# Patient Record
Sex: Male | Born: 1985 | Race: White | Hispanic: No | Marital: Single | Smoking: Current every day smoker
Health system: Southern US, Community
[De-identification: ages and names within clinical notes are randomized; demographics above are authoritative.]

## PROBLEM LIST (undated history)

## (undated) DIAGNOSIS — R569 Unspecified convulsions: Secondary | ICD-10-CM

## (undated) DIAGNOSIS — R011 Cardiac murmur, unspecified: Secondary | ICD-10-CM

## (undated) HISTORY — PX: HAND SURGERY: SHX662

## (undated) HISTORY — PX: ABSCESS DRAINAGE: SHX1119

---
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
## 2014-11-17 ENCOUNTER — Encounter (HOSPITAL_COMMUNITY): Payer: Self-pay | Admitting: *Deleted

## 2014-11-17 ENCOUNTER — Emergency Department (HOSPITAL_COMMUNITY)
Admission: EM | Admit: 2014-11-17 | Discharge: 2014-11-17 | Disposition: A | Discharge status: Home / Self Care | Payer: Self-pay | Attending: Emergency Medicine | Admitting: Emergency Medicine

## 2014-11-17 DIAGNOSIS — Z720 Tobacco use: Secondary | ICD-10-CM | POA: Insufficient documentation

## 2014-11-17 DIAGNOSIS — Z880 Allergy status to penicillin: Secondary | ICD-10-CM | POA: Insufficient documentation

## 2014-11-17 DIAGNOSIS — K029 Dental caries, unspecified: Secondary | ICD-10-CM | POA: Insufficient documentation

## 2014-11-17 DIAGNOSIS — K0889 Other specified disorders of teeth and supporting structures: Secondary | ICD-10-CM

## 2014-11-17 DIAGNOSIS — K047 Periapical abscess without sinus: Secondary | ICD-10-CM | POA: Insufficient documentation

## 2014-11-17 MED ORDER — HYDROCODONE-ACETAMINOPHEN 5-325 MG PO TABS
1.0000 | ORAL_TABLET | Freq: Once | ORAL | Status: AC
  Administered 2014-11-17: 1 via ORAL
  Filled 2014-11-17: qty 1

## 2014-11-17 MED ORDER — HYDROCODONE-ACETAMINOPHEN 5-325 MG PO TABS: 1.0000 | ORAL_TABLET | ORAL | Status: DC | PRN

## 2014-11-17 MED ORDER — CLINDAMYCIN HCL 150 MG PO CAPS
300.0000 mg | ORAL_CAPSULE | Freq: Once | ORAL | Status: AC
  Administered 2014-11-17: 300 mg via ORAL
  Filled 2014-11-17: qty 2

## 2014-11-17 MED ORDER — NAPROXEN 500 MG PO TABS: 500.0000 mg | ORAL_TABLET | Freq: Two times a day (BID) | ORAL | Status: DC

## 2014-11-17 MED ORDER — CLINDAMYCIN HCL 150 MG PO CAPS: 300.0000 mg | ORAL_CAPSULE | Freq: Three times a day (TID) | ORAL | Status: DC

## 2014-11-17 NOTE — ED Provider Notes (Signed)
CSN: [id number]     Arrival date & time 11/17/14  1606 History   First MD Initiated Contact with Patient 11/17/14 1639     Chief Complaint  Patient presents with  . Dental Pain     (Consider location/radiation/quality/duration/timing/severity/associated sxs/prior Treatment) Patient is a 29 y.o. male presenting with tooth pain. The history is provided by the patient.  Dental Pain Severity:  Moderate Onset quality:  Gradual Duration:  3 days Timing:  Constant Progression:  Worsening Chronicity:  New Context: abscess   Relieved by:  Nothing Worsened by:  Cold food/drink and pressure Ineffective treatments:  Topical anesthetic gel Associated symptoms: facial pain and facial swelling   Risk factors: smoking    Trevor Pacheco is a 29 y.o. male who presents to the ED with dental pain that started 3 days ago and facial swelling that started last night.    History reviewed. No pertinent past medical history. Past Surgical History  Procedure Laterality Date  . Hand surgery     No family history on file. Social History  Substance Use Topics  . Smoking status: Current Every Day Smoker -- 0.50 packs/day    Types: Cigarettes  . Smokeless tobacco: None  . Alcohol Use: No    Review of Systems  HENT: Positive for facial swelling.       Allergies  Penicillins  Home Medications   Prior to Admission medications   Medication Sig Start Date End Date Taking? Authorizing Provider  clindamycin (CLEOCIN) 150 MG capsule Take 2 capsules (300 mg total) by mouth 3 (three) times daily. 11/17/14   Analiyah Lechuga Orlene OchM Ahmod Gillespie, NP  HYDROcodone-acetaminophen (NORCO/VICODIN) 5-325 MG tablet Take 1 tablet by mouth every 4 (four) hours as needed. 11/17/14   Jayda White Orlene OchM Cadynce Garrette, NP  naproxen (NAPROSYN) 500 MG tablet Take 1 tablet (500 mg total) by mouth 2 (two) times daily. 11/17/14   Calyb Mcquarrie Orlene OchM Shantanique Hodo, NP   BP 114/58 mmHg  Pulse 59  Temp(Src) 98.1 F (36.7 C) (Oral)  Resp 18  Ht 6\' 1"  (1.854 m)  Wt 162 lb (73.483  kg)  BMI 21.38 kg/m2  SpO2 100% Physical Exam  Constitutional: He is oriented to person, place, and time. He appears well-developed and well-nourished. No distress.  HENT:  Head: Atraumatic.    Mouth/Throat: Uvula is midline, oropharynx is clear and moist and mucous membranes are normal.    Swelling and tenderness to the left cheek. Multiple dental caries that extend into the gumline, tender on exam. Abscess noted to gum above the decayed teeth.  Eyes: Conjunctivae and EOM are normal.  Neck: Neck supple.  Cardiovascular: Normal rate and regular rhythm.   Pulmonary/Chest: Effort normal and breath sounds normal.  Abdominal: Soft. There is no tenderness.  Musculoskeletal: Normal range of motion.  Lymphadenopathy:    He has cervical adenopathy.  Neurological: He is alert and oriented to person, place, and time. No cranial nerve deficit.  Skin: Skin is warm and dry.  Psychiatric: He has a normal mood and affect. His behavior is normal.  Nursing note and vitals reviewed.   ED Course  Procedures (including critical care time) Labs Review  MDM  29 y.o. male with dental pain due to abscess and caries. Stable for d/c without fever and does not appear toxic. Will treat with antibiotics and pain medication and he will follow up with a dentist as soon as possible. Information regarding dental clinic in the area given to the patient.  Discussed with the patient and all questioned  fully answered. He will return here if any problems arise.   Final diagnoses:  Dental abscess  Dental caries  Pain, dental       Janne Napoleon, NP 11/17/14 1700  Samuel Jester, DO 11/21/14 1919

## 2014-11-17 NOTE — Discharge Instructions (Signed)
Follow up with a dentist as soon as possible. Do not take the narcotic if driving as it will make you sleepy.

## 2014-11-17 NOTE — ED Notes (Signed)
Pt states left-sided dental pain began Tuesday and  Swelling occurred over night. NAD. Has used oragel and benadryl at home.

## 2014-11-18 ENCOUNTER — Emergency Department (HOSPITAL_COMMUNITY)
Admission: EM | Admit: 2014-11-18 | Discharge: 2014-11-18 | Disposition: A | Discharge status: Home / Self Care | Payer: Self-pay | Attending: Emergency Medicine | Admitting: Emergency Medicine

## 2014-11-18 ENCOUNTER — Encounter (HOSPITAL_COMMUNITY): Payer: Self-pay | Admitting: *Deleted

## 2014-11-18 DIAGNOSIS — Z792 Long term (current) use of antibiotics: Secondary | ICD-10-CM | POA: Insufficient documentation

## 2014-11-18 DIAGNOSIS — K029 Dental caries, unspecified: Secondary | ICD-10-CM | POA: Insufficient documentation

## 2014-11-18 DIAGNOSIS — Z880 Allergy status to penicillin: Secondary | ICD-10-CM | POA: Insufficient documentation

## 2014-11-18 DIAGNOSIS — Z720 Tobacco use: Secondary | ICD-10-CM | POA: Insufficient documentation

## 2014-11-18 DIAGNOSIS — K047 Periapical abscess without sinus: Secondary | ICD-10-CM | POA: Insufficient documentation

## 2014-11-18 DIAGNOSIS — Z791 Long term (current) use of non-steroidal anti-inflammatories (NSAID): Secondary | ICD-10-CM | POA: Insufficient documentation

## 2014-11-18 MED ORDER — OXYCODONE-ACETAMINOPHEN 5-325 MG PO TABS: 1.0000 | ORAL_TABLET | ORAL | Status: DC | PRN

## 2014-11-18 MED ORDER — OXYCODONE-ACETAMINOPHEN 5-325 MG PO TABS
1.0000 | ORAL_TABLET | Freq: Once | ORAL | Status: AC
  Administered 2014-11-18: 1 via ORAL
  Filled 2014-11-18: qty 1

## 2014-11-18 MED ORDER — CLINDAMYCIN PHOSPHATE 600 MG/50ML IV SOLN
600.0000 mg | Freq: Once | INTRAVENOUS | Status: AC
  Administered 2014-11-18: 600 mg via INTRAVENOUS
  Filled 2014-11-18: qty 50

## 2014-11-18 NOTE — ED Notes (Signed)
Dental pain with facial swelling, return visit, worse today

## 2014-11-18 NOTE — Discharge Instructions (Signed)
Dental Abscess A dental abscess is pus in or around a tooth. HOME CARE  Take medicines only as told by your dentist.  If you were prescribed antibiotic medicine, finish all of it even if you start to feel better.  Rinse your mouth (gargle) often with salt water.  Do not drive or use heavy machinery, like a lawn mower, while taking pain medicine.  Do not apply heat to the outside of your mouth.  Keep all follow-up visits as told by your dentist. This is important. GET HELP IF:  Your pain is worse, and medicine does not help. GET HELP RIGHT AWAY IF:  You have a fever or chills.  Your symptoms suddenly get worse.  You have a very bad headache.  You have problems breathing or swallowing.  You have trouble opening your mouth.  You have puffiness (swelling) in your neck or around your eye.   This information is not intended to replace advice given to you by your health care provider. Make sure you discuss any questions you have with your health care provider.   Document Released: 05/30/2014 Document Reviewed: 05/30/2014 Elsevier Interactive Patient Education 2016 [employer]  

## 2014-11-20 NOTE — ED Provider Notes (Signed)
CSN: [id number]     Arrival date & time 11/18/14  1149 History   First MD Initiated Contact with Patient 11/18/14 1156     Chief Complaint  Patient presents with  . Dental Pain     (Consider location/radiation/quality/duration/timing/severity/associated sxs/prior Treatment) HPI   Trevor Pacheco is a 29 y.o. male who presents to the Emergency Department complaining of continued dental pain.  He states he was seen here on the previous day for same and returns today due to continued pain and increased swelling of his face.  States he is taking clindamycin and vicodin w/o relief.  He denies fever, difficulty swallowing, and neck pain.    History reviewed. No pertinent past medical history. Past Surgical History  Procedure Laterality Date  . Hand surgery     No family history on file. Social History  Substance Use Topics  . Smoking status: Current Every Day Smoker -- 0.50 packs/day    Types: Cigarettes  . Smokeless tobacco: None  . Alcohol Use: No    Review of Systems  Constitutional: Negative for fever and appetite change.  HENT: Positive for dental problem and facial swelling. Negative for congestion, sore throat and trouble swallowing.   Eyes: Negative for pain and visual disturbance.  Respiratory: Negative for shortness of breath.   Musculoskeletal: Negative for neck pain and neck stiffness.  Neurological: Negative for dizziness, facial asymmetry and headaches.  Hematological: Negative for adenopathy.  All other systems reviewed and are negative.     Allergies  Penicillins  Home Medications   Prior to Admission medications   Medication Sig Start Date End Date Taking? Authorizing Provider  clindamycin (CLEOCIN) 150 MG capsule Take 2 capsules (300 mg total) by mouth 3 (three) times daily. 11/17/14  Yes Hope Orlene OchM Neese, NP  HYDROcodone-acetaminophen (NORCO/VICODIN) 5-325 MG tablet Take 1 tablet by mouth every 4 (four) hours as needed. Patient taking differently: Take 1  tablet by mouth every 4 (four) hours as needed (dental pain).  11/17/14  Yes Hope Orlene OchM Neese, NP  naproxen (NAPROSYN) 500 MG tablet Take 1 tablet (500 mg total) by mouth 2 (two) times daily. 11/17/14  Yes Hope Orlene OchM Neese, NP  oxyCODONE-acetaminophen (PERCOCET/ROXICET) 5-325 MG tablet Take 1 tablet by mouth every 4 (four) hours as needed. 11/18/14   Marguis Mathieson, PA-C   BP 116/72 mmHg  Pulse 61  Temp(Src) 98.2 F (36.8 C) (Oral)  Resp 12  Ht 6\' 2"  (1.88 m)  Wt 162 lb (73.483 kg)  BMI 20.79 kg/m2  SpO2 100% Physical Exam  Constitutional: He is oriented to person, place, and time. He appears well-developed and well-nourished. No distress.  HENT:  Head: Normocephalic and atraumatic.  Right Ear: Tympanic membrane and ear canal normal.  Left Ear: Tympanic membrane and ear canal normal.  Mouth/Throat: Uvula is midline, oropharynx is clear and moist and mucous membranes are normal. No trismus in the jaw. Dental caries present. No dental abscesses or uvula swelling.  Wide spread dental decay and tenderness of the left upper central, lateral incisors and premolars.  Mild to moderate left facial swelling, no obvious dental abscess, no trismus, or sublingual abnml.    Neck: Normal range of motion. Neck supple.  Cardiovascular: Normal rate, regular rhythm and normal heart sounds.   No murmur heard. Pulmonary/Chest: Effort normal and breath sounds normal. No respiratory distress.  Musculoskeletal: Normal range of motion.  Lymphadenopathy:    He has no cervical adenopathy.  Neurological: He is alert and oriented to person, place, and  time. He exhibits normal muscle tone. Coordination normal.  Skin: Skin is warm and dry.  Nursing note and vitals reviewed.   ED Course  Procedures (including critical care time)   MDM   Final diagnoses:  Dental abscess   Pt given IV clindamycin here.    Pt seen here recently for same.  Airway is patent, uvula midline .  Non-toxic.  No concerning sx's for  Ludwig's angina.   Pt advised to f/u soon with a dentist, referral info given.  He will continue clindamycin as directed,  # 8 percocet for pain    Pauline Aus, PA-C 11/20/14 2018  Jerelyn Scott, MD 11/28/14 2302

## 2016-07-06 ENCOUNTER — Encounter (HOSPITAL_COMMUNITY): Payer: Self-pay | Admitting: Emergency Medicine

## 2016-07-06 ENCOUNTER — Emergency Department (HOSPITAL_COMMUNITY)
Admission: EM | Admit: 2016-07-06 | Discharge: 2016-07-07 | Disposition: A | Discharge status: Home / Self Care | Payer: Self-pay | Attending: Emergency Medicine | Admitting: Emergency Medicine

## 2016-07-06 DIAGNOSIS — K047 Periapical abscess without sinus: Secondary | ICD-10-CM | POA: Insufficient documentation

## 2016-07-06 DIAGNOSIS — F17210 Nicotine dependence, cigarettes, uncomplicated: Secondary | ICD-10-CM | POA: Insufficient documentation

## 2016-07-06 DIAGNOSIS — K029 Dental caries, unspecified: Secondary | ICD-10-CM | POA: Insufficient documentation

## 2016-07-06 HISTORY — DX: Cardiac murmur, unspecified: R01.1

## 2016-07-06 NOTE — ED Triage Notes (Signed)
Pt c/o right side dental/ear pain x 1 week.

## 2016-07-07 MED ORDER — IBUPROFEN 800 MG PO TABS
800.0000 mg | ORAL_TABLET | Freq: Once | ORAL | Status: AC
  Administered 2016-07-07: 800 mg via ORAL
  Filled 2016-07-07: qty 1

## 2016-07-07 MED ORDER — IBUPROFEN 800 MG PO TABS: 800.0000 mg | ORAL_TABLET | Freq: Three times a day (TID) | ORAL | 0 refills | Status: DC

## 2016-07-07 MED ORDER — NEOMYCIN-POLYMYXIN-HC 1 % OT SOLN
4.0000 [drp] | Freq: Once | OTIC | Status: AC
  Administered 2016-07-07: 4 [drp] via OTIC
  Filled 2016-07-07: qty 10

## 2016-07-07 MED ORDER — CLINDAMYCIN HCL 150 MG PO CAPS: ORAL_CAPSULE | ORAL | 0 refills | Status: DC

## 2016-07-07 MED ORDER — ONDANSETRON HCL 4 MG PO TABS
4.0000 mg | ORAL_TABLET | Freq: Once | ORAL | Status: AC
  Administered 2016-07-07: 4 mg via ORAL
  Filled 2016-07-07: qty 1

## 2016-07-07 MED ORDER — TRAMADOL HCL 50 MG PO TABS: 50.0000 mg | ORAL_TABLET | Freq: Four times a day (QID) | ORAL | 0 refills | Status: DC | PRN

## 2016-07-07 MED ORDER — TRAMADOL HCL 50 MG PO TABS
100.0000 mg | ORAL_TABLET | Freq: Once | ORAL | Status: AC
  Administered 2016-07-07: 100 mg via ORAL
  Filled 2016-07-07: qty 2

## 2016-07-07 MED ORDER — CLINDAMYCIN HCL 150 MG PO CAPS
300.0000 mg | ORAL_CAPSULE | Freq: Once | ORAL | Status: AC
  Administered 2016-07-07: 300 mg via ORAL
  Filled 2016-07-07: qty 2

## 2016-07-07 NOTE — ED Notes (Signed)
Pt ambulatory to waiting room. Pt verbalized understanding of discharge instructions.   

## 2016-07-07 NOTE — ED Provider Notes (Signed)
[facility]-EMERGENCY DEPT Provider Note   CSN: [id number] Arrival date & time: 07/06/16  2118     History   Chief Complaint Chief Complaint  Patient presents with  . Dental Pain    HPI Trevor Pacheco is a 31 y.o. male.  The history is provided by the patient.  Dental Pain   This is a recurrent problem. The current episode started more than 1 week ago. The problem occurs daily. The problem has been gradually worsening. The pain is moderate. He has tried acetaminophen (warm salt water) for the symptoms. The treatment provided no relief.    Past Medical History:  Diagnosis Date  . Heart murmur     There are no active problems to display for this patient.   Past Surgical History:  Procedure Laterality Date  . HAND SURGERY         Home Medications    Prior to Admission medications   Medication Sig Start Date End Date Taking? Authorizing Provider  clindamycin (CLEOCIN) 150 MG capsule Take 2 capsules (300 mg total) by mouth 3 (three) times daily. 11/17/14   Janne NapoleonNeese, Hope M, NP  HYDROcodone-acetaminophen (NORCO/VICODIN) 5-325 MG tablet Take 1 tablet by mouth every 4 (four) hours as needed. Patient taking differently: Take 1 tablet by mouth every 4 (four) hours as needed (dental pain).  11/17/14   Janne NapoleonNeese, Hope M, NP  naproxen (NAPROSYN) 500 MG tablet Take 1 tablet (500 mg total) by mouth 2 (two) times daily. 11/17/14   Janne NapoleonNeese, Hope M, NP  oxyCODONE-acetaminophen (PERCOCET/ROXICET) 5-325 MG tablet Take 1 tablet by mouth every 4 (four) hours as needed. 11/18/14   Pauline Ausriplett, Tammy, PA-C    Family History History reviewed. No pertinent family history.  Social History Social History  Substance Use Topics  . Smoking status: Current Every Day Smoker    Packs/day: 0.50    Types: Cigarettes  . Smokeless tobacco: Never Used  . Alcohol use No     Allergies   Penicillins   Review of Systems Review of Systems  Constitutional: Negative for activity change, chills and fever.     All ROS Neg except as noted in HPI  HENT: Positive for dental problem and ear pain. Negative for nosebleeds.   Eyes: Negative for photophobia and discharge.  Respiratory: Negative for cough, shortness of breath and wheezing.   Cardiovascular: Negative for chest pain and palpitations.  Gastrointestinal: Negative for abdominal pain and blood in stool.  Genitourinary: Negative for dysuria, frequency and hematuria.  Musculoskeletal: Negative for arthralgias, back pain and neck pain.  Skin: Negative.   Neurological: Positive for headaches. Negative for dizziness, seizures and speech difficulty.  Psychiatric/Behavioral: Negative for confusion and hallucinations.     Physical Exam Updated Vital Signs BP (!) 130/99 (BP Location: Right Arm)   Pulse (!) 110   Temp 98.5 F (36.9 C) (Oral)   Resp 18   Ht 6\' 2"  (1.88 m)   Wt 73.9 kg (163 lb)   SpO2 99%   BMI 20.93 kg/m   Physical Exam  Constitutional: He is oriented to person, place, and time. He appears well-developed and well-nourished.  Non-toxic appearance.  HENT:  Head: Normocephalic.  Right Ear: Tympanic membrane and external ear normal.  Left Ear: Tympanic membrane and external ear normal.  Multiple dental caries present. Swelling at the right molar area with question of abscess. Airway patent. No swelling under the tongue  Eyes: EOM and lids are normal. Pupils are equal, round, and reactive to light.  Neck:  Normal range of motion. Neck supple. Carotid bruit is not present.  Cardiovascular: Normal rate, regular rhythm, normal heart sounds, intact distal pulses and normal pulses.   Pulmonary/Chest: Breath sounds normal. No respiratory distress.  Abdominal: Soft. Bowel sounds are normal. There is no tenderness. There is no guarding.  Musculoskeletal: Normal range of motion.  Lymphadenopathy:       Head (right side): No submandibular adenopathy present.       Head (left side): No submandibular adenopathy present.    He has no  cervical adenopathy.  Neurological: He is alert and oriented to person, place, and time. He has normal strength. No cranial nerve deficit or sensory deficit.  Skin: Skin is warm and dry.  Psychiatric: He has a normal mood and affect. His speech is normal.  Nursing note and vitals reviewed.    ED Treatments / Results  Labs (all labs ordered are listed, but only abnormal results are displayed) Labs Reviewed - No data to display  EKG  EKG Interpretation None       Radiology No results found.  Procedures Procedures (including critical care time)  Medications Ordered in ED Medications - No data to display   Initial Impression / Assessment and Plan / ED Course  I have reviewed the triage vital signs and the nursing notes.  Pertinent labs & imaging results that were available during my care of the patient were reviewed by me and considered in my medical decision making (see chart for details).      Final Clinical Impressions(s) / ED Diagnoses  MDM Vital signs within normal limits with exception of the heart rate being elevated at 110.  Description of an abscess of the right molar area. The ear exam is okay. There's no evidence for Ludwig's angina or other acute changes. Patient will be treated with clindamycin, ibuprofen, and ultram. . Patient strongly encouraged to see a dentist systems possible. Patient is understanding of the discharge instructions. Patient will return to the emergency department if any emergent changes, problems, or concerns.    Final diagnoses:  Dental abscess    New Prescriptions Discharge Medication List as of 07/07/2016 12:45 AM    START taking these medications   Details  ibuprofen (ADVIL,MOTRIN) 800 MG tablet Take 1 tablet (800 mg total) by mouth 3 (three) times daily., Starting Mon 07/07/2016, Print    traMADol (ULTRAM) 50 MG tablet Take 1 tablet (50 mg total) by mouth every 6 (six) hours as needed., Starting Mon 07/07/2016, Print           Beverely Pace Kalida, PA-C 07/09/16 0981    Dione Booze, MD 07/10/16 [phone]

## 2016-07-07 NOTE — Discharge Instructions (Signed)
Your examination reveals an abscess of the right lower jaw and multiple infected teeth. You also have a scratch in your right external ear. Please use 4 Cortisporin otic drops to your ear 3 times daily. Please use clindamycin daily until all taken. It is important that she see a dentist as soon as possible concerning the abscess and the multiple infected teeth.

## 2016-09-23 DIAGNOSIS — K122 Cellulitis and abscess of mouth: Secondary | ICD-10-CM | POA: Insufficient documentation

## 2017-10-30 ENCOUNTER — Other Ambulatory Visit: Payer: Self-pay

## 2017-10-30 ENCOUNTER — Emergency Department (HOSPITAL_COMMUNITY)
Admission: EM | Admit: 2017-10-30 | Discharge: 2017-10-30 | Disposition: A | Discharge status: Home / Self Care | Payer: Self-pay | Attending: Emergency Medicine | Admitting: Emergency Medicine

## 2017-10-30 ENCOUNTER — Encounter (HOSPITAL_COMMUNITY): Payer: Self-pay | Admitting: Emergency Medicine

## 2017-10-30 DIAGNOSIS — K0889 Other specified disorders of teeth and supporting structures: Secondary | ICD-10-CM

## 2017-10-30 DIAGNOSIS — K029 Dental caries, unspecified: Secondary | ICD-10-CM | POA: Insufficient documentation

## 2017-10-30 DIAGNOSIS — F17210 Nicotine dependence, cigarettes, uncomplicated: Secondary | ICD-10-CM | POA: Insufficient documentation

## 2017-10-30 MED ORDER — ONDANSETRON HCL 4 MG PO TABS
4.0000 mg | ORAL_TABLET | Freq: Once | ORAL | Status: AC
Start: 2017-10-30 — End: 2017-10-30
  Administered 2017-10-30: 4 mg via ORAL
  Filled 2017-10-30: qty 1

## 2017-10-30 MED ORDER — CLINDAMYCIN HCL 150 MG PO CAPS: ORAL_CAPSULE | ORAL | 0 refills | Status: DC

## 2017-10-30 MED ORDER — CLINDAMYCIN HCL 150 MG PO CAPS: 300.0000 mg | ORAL_CAPSULE | Freq: Once | ORAL | Status: DC

## 2017-10-30 MED ORDER — TRAMADOL HCL 50 MG PO TABS
100.0000 mg | ORAL_TABLET | Freq: Once | ORAL | Status: AC
  Administered 2017-10-30: 100 mg via ORAL
  Filled 2017-10-30: qty 2

## 2017-10-30 MED ORDER — ONDANSETRON HCL 4 MG PO TABS: 4.0000 mg | ORAL_TABLET | Freq: Once | ORAL | Status: DC

## 2017-10-30 MED ORDER — IBUPROFEN 800 MG PO TABS
800.0000 mg | ORAL_TABLET | Freq: Once | ORAL | Status: AC
  Administered 2017-10-30: 800 mg via ORAL
  Filled 2017-10-30: qty 1

## 2017-10-30 MED ORDER — CEFTRIAXONE SODIUM 1 G IJ SOLR
1.0000 g | Freq: Once | INTRAMUSCULAR | Status: AC
  Administered 2017-10-30: 1 g via INTRAMUSCULAR
  Filled 2017-10-30: qty 10

## 2017-10-30 MED ORDER — IBUPROFEN 800 MG PO TABS: 800.0000 mg | ORAL_TABLET | Freq: Once | ORAL | Status: DC

## 2017-10-30 MED ORDER — IBUPROFEN 600 MG PO TABS: 600.0000 mg | ORAL_TABLET | Freq: Four times a day (QID) | ORAL | 0 refills | Status: DC

## 2017-10-30 MED ORDER — CLINDAMYCIN HCL 150 MG PO CAPS
300.0000 mg | ORAL_CAPSULE | Freq: Once | ORAL | Status: AC
  Administered 2017-10-30: 300 mg via ORAL
  Filled 2017-10-30: qty 2

## 2017-10-30 NOTE — Discharge Instructions (Signed)
Your blood pressure is slightly elevated, otherwise your vital signs are within normal limits.  Your oxygen level is 98% on room air.  Within normal limits by my interpretation.  You have multiple areas of dental infection and decay.  It is extremely important that you see a dentist as soon as possible.  I have placed dental resource information in your discharge instructions for your convenience.

## 2017-10-30 NOTE — ED Provider Notes (Signed)
[facility] EMERGENCY DEPARTMENT Provider Note   CSN: [id number] Arrival date & time: 10/30/17  4098     History   Chief Complaint Chief Complaint  Patient presents with  . Dental Pain    HPI Trevor Pacheco is a 32 y.o. male.  The history is provided by the patient.  Dental Pain   This is a new problem. The current episode started more than 1 week ago. The problem occurs daily. The problem has been gradually worsening. The pain is moderate. Treatments tried: antibiotics. The treatment provided mild relief.    Past Medical History:  Diagnosis Date  . Heart murmur     There are no active problems to display for this patient.   Past Surgical History:  Procedure Laterality Date  . HAND SURGERY          Home Medications    Prior to Admission medications   Medication Sig Start Date End Date Taking? Authorizing Provider  clindamycin (CLEOCIN) 150 MG capsule 2 po bid with food 07/07/16   Ivery Quale, PA-C  HYDROcodone-acetaminophen (NORCO/VICODIN) 5-325 MG tablet Take 1 tablet by mouth every 4 (four) hours as needed. Patient taking differently: Take 1 tablet by mouth every 4 (four) hours as needed (dental pain).  11/17/14   Janne Napoleon, NP  ibuprofen (ADVIL,MOTRIN) 800 MG tablet Take 1 tablet (800 mg total) by mouth 3 (three) times daily. 07/07/16   Ivery Quale, PA-C  naproxen (NAPROSYN) 500 MG tablet Take 1 tablet (500 mg total) by mouth 2 (two) times daily. 11/17/14   Janne Napoleon, NP  oxyCODONE-acetaminophen (PERCOCET/ROXICET) 5-325 MG tablet Take 1 tablet by mouth every 4 (four) hours as needed. 11/18/14   Triplett, Tammy, PA-C  traMADol (ULTRAM) 50 MG tablet Take 1 tablet (50 mg total) by mouth every 6 (six) hours as needed. 07/07/16   Ivery Quale, PA-C    Family History History reviewed. No pertinent family history.  Social History Social History   Tobacco Use  . Smoking status: Current Every Day Smoker    Packs/day: 0.50    Types: Cigarettes  .  Smokeless tobacco: Never Used  Substance Use Topics  . Alcohol use: No  . Drug use: Not on file     Allergies   Penicillins   Review of Systems Review of Systems  Constitutional: Negative for activity change and fever.       All ROS Neg except as noted in HPI  HENT: Positive for dental problem. Negative for nosebleeds.   Eyes: Negative for photophobia and discharge.  Respiratory: Negative for cough, shortness of breath and wheezing.   Cardiovascular: Negative for chest pain and palpitations.  Gastrointestinal: Positive for nausea. Negative for abdominal pain, blood in stool and vomiting.  Genitourinary: Negative for dysuria, frequency and hematuria.  Musculoskeletal: Negative for arthralgias, back pain and neck pain.  Skin: Negative.   Neurological: Positive for headaches. Negative for dizziness, seizures and speech difficulty.  Psychiatric/Behavioral: Negative for confusion and hallucinations.     Physical Exam Updated Vital Signs BP (!) 152/95 (BP Location: Left Arm)   Pulse 68   Temp 97.7 F (36.5 C) (Oral)   Resp 18   Ht 6\' 2"  (1.88 m)   Wt 76.2 kg   SpO2 98%   BMI 21.57 kg/m   Physical Exam  Constitutional: He is oriented to person, place, and time. He appears well-developed and well-nourished.  Non-toxic appearance.  HENT:  Head: Normocephalic.  Right Ear: Tympanic membrane and external ear  normal.  Left Ear: Tympanic membrane and external ear normal.  Mouth/Throat: No trismus in the jaw. Abnormal dentition. Dental caries present. No uvula swelling.  Multiple dental caries of the upper and lower jaw area.  There is some swelling of the gum, but no visible abscess.  The airway is patent.  There is no swelling under the tongue.  Eyes: Pupils are equal, round, and reactive to light. EOM and lids are normal.  Neck: Normal range of motion. Neck supple. Carotid bruit is not present.  Cardiovascular: Normal rate, regular rhythm, normal heart sounds, intact distal  pulses and normal pulses.  Pulmonary/Chest: Breath sounds normal. No respiratory distress.  Abdominal: Soft. Bowel sounds are normal. There is no tenderness. There is no guarding.  Musculoskeletal: Normal range of motion.  Lymphadenopathy:       Head (right side): No submandibular adenopathy present.       Head (left side): No submandibular adenopathy present.    He has no cervical adenopathy.  Neurological: He is alert and oriented to person, place, and time. He has normal strength. No cranial nerve deficit or sensory deficit.  Skin: Skin is warm and dry.  Psychiatric: He has a normal mood and affect. His speech is normal.  Nursing note and vitals reviewed.    ED Treatments / Results  Labs (all labs ordered are listed, but only abnormal results are displayed) Labs Reviewed - No data to display  EKG None  Radiology No results found.  Procedures Procedures (including critical care time)  Medications Ordered in ED Medications - No data to display   Initial Impression / Assessment and Plan / ED Course  I have reviewed the triage vital signs and the nursing notes.  Pertinent labs & imaging results that were available during my care of the patient were reviewed by me and considered in my medical decision making (see chart for details).       Final Clinical Impressions(s) / ED Diagnoses MDM  Vital signs are within normal limits.  Patient has multiple dental caries of the upper and lower extremities.  Patient has had problems with his teeth for quite some time.  He says that he does not have money and does not have transportation to get to any other clinics or dentist.  He has been treated multiple times in the emergency department for dental infections.  I have discussed with the patient the importance of seeing a dentist as soon as possible.  I discussed with him the danger of not having this dental issue addressed formally.   Prescription for clindamycin and ibuprofen given  to the patient.  Questions were answered.  Patient is in agreement with this plan.   Final diagnoses:  Dental caries  Pain, dental    ED Discharge Orders         Ordered    clindamycin (CLEOCIN) 150 MG capsule     10/30/17 1102    ibuprofen (ADVIL,MOTRIN) 600 MG tablet  4 times daily     10/30/17 1102           Ivery Quale, PA-C 10/30/17 1116    Bethann Berkshire, MD 10/30/17 1617

## 2017-10-30 NOTE — ED Triage Notes (Signed)
Pt state having left sided dental pain and states having an "abscess"

## 2017-11-24 ENCOUNTER — Emergency Department (HOSPITAL_COMMUNITY)
Admission: EM | Admit: 2017-11-24 | Discharge: 2017-11-25 | Disposition: A | Discharge status: Home / Self Care | Payer: Self-pay | Attending: Emergency Medicine | Admitting: Emergency Medicine

## 2017-11-24 ENCOUNTER — Other Ambulatory Visit: Payer: Self-pay

## 2017-11-24 ENCOUNTER — Encounter (HOSPITAL_COMMUNITY): Payer: Self-pay | Admitting: Emergency Medicine

## 2017-11-24 DIAGNOSIS — Z7982 Long term (current) use of aspirin: Secondary | ICD-10-CM | POA: Insufficient documentation

## 2017-11-24 DIAGNOSIS — F17210 Nicotine dependence, cigarettes, uncomplicated: Secondary | ICD-10-CM | POA: Insufficient documentation

## 2017-11-24 DIAGNOSIS — K0889 Other specified disorders of teeth and supporting structures: Secondary | ICD-10-CM

## 2017-11-24 DIAGNOSIS — K029 Dental caries, unspecified: Secondary | ICD-10-CM | POA: Insufficient documentation

## 2017-11-24 MED ORDER — IBUPROFEN 800 MG PO TABS
800.0000 mg | ORAL_TABLET | Freq: Once | ORAL | Status: AC
  Administered 2017-11-24: 800 mg via ORAL
  Filled 2017-11-24: qty 1

## 2017-11-24 MED ORDER — ACETAMINOPHEN 500 MG PO TABS
1000.0000 mg | ORAL_TABLET | Freq: Once | ORAL | Status: AC
  Administered 2017-11-24: 1000 mg via ORAL
  Filled 2017-11-24: qty 2

## 2017-11-24 MED ORDER — PROMETHAZINE HCL 12.5 MG PO TABS
12.5000 mg | ORAL_TABLET | Freq: Once | ORAL | Status: AC
  Administered 2017-11-24: 12.5 mg via ORAL
  Filled 2017-11-24: qty 1

## 2017-11-24 MED ORDER — IBUPROFEN 600 MG PO TABS: 600.0000 mg | ORAL_TABLET | Freq: Four times a day (QID) | ORAL | 0 refills | Status: DC

## 2017-11-24 MED ORDER — CLINDAMYCIN HCL 150 MG PO CAPS: 150.0000 mg | ORAL_CAPSULE | Freq: Four times a day (QID) | ORAL | 0 refills | Status: DC

## 2017-11-24 MED ORDER — CLINDAMYCIN HCL 150 MG PO CAPS
300.0000 mg | ORAL_CAPSULE | Freq: Once | ORAL | Status: AC
  Administered 2017-11-24: 300 mg via ORAL
  Filled 2017-11-24: qty 2

## 2017-11-24 NOTE — ED Triage Notes (Signed)
Pt c/o left sided dental pain x 2 mos, was seen for same previously, pt states abx helped but pain has came back, has not seen a dentist

## 2017-11-24 NOTE — Discharge Instructions (Addendum)
Your vital signs within normal limits.  Your oxygen level is 100% on room air.  Please use clindamycin, 600 mg of ibuprofen, and 1000 mg of Tylenol with breakfast, lunch, dinner, and at bedtime.  It is important that you see a dentist as soon as possible concerning your dental issue.

## 2017-11-24 NOTE — ED Provider Notes (Signed)
[facility] EMERGENCY DEPARTMENT Provider Note   CSN: [id number] Arrival date & time: 11/24/17  2141     History   Chief Complaint Chief Complaint  Patient presents with  . Dental Pain    HPI Trevor Pacheco is a 32 y.o. male.  Pt states he was seen in ED for this problem about 1 month ago. He reports the antibiotics helped, but when they ran out the pain got worse. He states he feels a knot on the left side of the neck that is sore at times.  The history is provided by the patient.  Dental Pain   This is a chronic problem. The current episode started more than 1 week ago. The problem occurs daily. The problem has been gradually worsening. The pain is moderate. He has tried nothing for the symptoms.    Past Medical History:  Diagnosis Date  . Heart murmur     There are no active problems to display for this patient.   Past Surgical History:  Procedure Laterality Date  . HAND SURGERY          Home Medications    Prior to Admission medications   Medication Sig Start Date End Date Taking? Authorizing Provider  acetaminophen (TYLENOL) 500 MG tablet Take 1,000 mg by mouth every 6 (six) hours as needed for mild pain or moderate pain.   Yes [provider]  aspirin 325 MG tablet Take 325 mg by mouth every other day.   Yes [provider]  ibuprofen (ADVIL,MOTRIN) 200 MG tablet Take 400 mg by mouth every 6 (six) hours as needed for mild pain or moderate pain.   Yes [provider]  clindamycin (CLEOCIN) 150 MG capsule 2 po bid with food Patient not taking: Reported on 11/24/2017 10/30/17   Ivery Quale, PA-C  ibuprofen (ADVIL,MOTRIN) 600 MG tablet Take 1 tablet (600 mg total) by mouth 4 (four) times daily. Patient not taking: Reported on 11/24/2017 10/30/17   Ivery Quale, PA-C    Family History History reviewed. No pertinent family history.  Social History Social History   Tobacco Use  . Smoking status: Current Every Day Smoker   Packs/day: 0.50    Types: Cigarettes  . Smokeless tobacco: Never Used  Substance Use Topics  . Alcohol use: No  . Drug use: Not on file     Allergies   Penicillins   Review of Systems Review of Systems  Constitutional: Negative for activity change, chills and fever.       All ROS Neg except as noted in HPI  HENT: Positive for dental problem. Negative for nosebleeds.   Eyes: Negative for photophobia and discharge.  Respiratory: Negative for cough, shortness of breath and wheezing.   Cardiovascular: Negative for chest pain and palpitations.  Gastrointestinal: Negative for abdominal pain and blood in stool.  Genitourinary: Negative for dysuria, frequency and hematuria.  Musculoskeletal: Negative for arthralgias, back pain and neck pain.  Skin: Negative.   Neurological: Positive for headaches. Negative for dizziness, seizures and speech difficulty.  Psychiatric/Behavioral: Negative for confusion and hallucinations.     Physical Exam Updated Vital Signs BP 138/73 (BP Location: Left Arm)   Pulse (!) 58   Temp 98.7 F (37.1 C) (Tympanic)   Resp 18   Wt 76.2 kg   SpO2 100%   BMI 21.57 kg/m   Physical Exam  Constitutional: He is oriented to person, place, and time. He appears well-developed and well-nourished.  Non-toxic appearance.  HENT:  Head: Normocephalic.  Right Ear: Tympanic membrane and external ear normal.  Left Ear: Tympanic membrane and external ear normal.  The oropharynx is clear.  The uvula is in the midline.  There is no swelling under the tongue.  There are multiple dental caries on the upper and lower jaw area.  There is swelling of the gum on the lower greater than the upper jaw area.  No visible abscess noted at this time.  Eyes: Pupils are equal, round, and reactive to light. EOM and lids are normal.  Neck: Normal range of motion. Neck supple. Carotid bruit is not present.  Cardiovascular: Normal rate, regular rhythm, normal heart sounds, intact distal  pulses and normal pulses. Exam reveals no gallop and no friction rub.  No murmur heard. Pulmonary/Chest: Breath sounds normal. No respiratory distress.  Abdominal: Soft. Bowel sounds are normal. There is no tenderness. There is no guarding.  Musculoskeletal: Normal range of motion.  Lymphadenopathy:       Head (right side): No submandibular adenopathy present.       Head (left side): No submandibular adenopathy present.    He has no cervical adenopathy.  Neurological: He is alert and oriented to person, place, and time. He has normal strength. No cranial nerve deficit or sensory deficit. He exhibits normal muscle tone. Coordination normal.  Skin: Skin is warm and dry.  Psychiatric: He has a normal mood and affect. His speech is normal.  Nursing note and vitals reviewed.    ED Treatments / Results  Labs (all labs ordered are listed, but only abnormal results are displayed) Labs Reviewed - No data to display  EKG None  Radiology No results found.  Procedures Procedures (including critical care time)  Medications Ordered in ED Medications - No data to display   Initial Impression / Assessment and Plan / ED Course  I have reviewed the triage vital signs and the nursing notes.  Pertinent labs & imaging results that were available during my care of the patient were reviewed by me and considered in my medical decision making (see chart for details).      Final Clinical Impressions(s) / ED Diagnoses MDM Patient presents to the emergency department for dental issues.  The patient has had this problem ongoing for quite some time.  Patient states that the pain seems to be getting worse instead of better.  Examination shows multiple dental caries.  No evidence for Ludewig's angina, or other emergent changes. Vital signs reviewed.  Patient placed on clindamycin and ibuprofen and Tylenol for soreness.  I have again strongly encouraged patient to see a dentist as soon as possible  concerning the multiple dental caries.  Patient acknowledges understanding of the instructions.     Final diagnoses:  Dental caries  Pain, dental    ED Discharge Orders         Ordered    clindamycin (CLEOCIN) 150 MG capsule  Every 6 hours     11/24/17 2346    ibuprofen (ADVIL,MOTRIN) 600 MG tablet  4 times daily     11/24/17 2346           Ivery Quale, PA-C 11/26/17 1255    Devoria Albe, MD 11/26/17 [phone]

## 2017-11-26 NOTE — ED Notes (Signed)
Pt called requesting prescriptions be transferred to [facility] in [place].  Called [facility] pharmacy and was told to have the receiving drug store call and request the transfer.  Notified patient and he verbalized understanding.

## 2019-08-05 ENCOUNTER — Encounter (HOSPITAL_COMMUNITY): Payer: Self-pay | Admitting: *Deleted

## 2019-08-05 ENCOUNTER — Emergency Department (HOSPITAL_COMMUNITY)
Admission: EM | Admit: 2019-08-05 | Discharge: 2019-08-05 | Disposition: A | Discharge status: Home / Self Care | Payer: Self-pay | Attending: Emergency Medicine | Admitting: Emergency Medicine

## 2019-08-05 ENCOUNTER — Other Ambulatory Visit: Payer: Self-pay

## 2019-08-05 ENCOUNTER — Emergency Department (HOSPITAL_COMMUNITY): Payer: Self-pay

## 2019-08-05 DIAGNOSIS — M25522 Pain in left elbow: Secondary | ICD-10-CM | POA: Insufficient documentation

## 2019-08-05 DIAGNOSIS — F17210 Nicotine dependence, cigarettes, uncomplicated: Secondary | ICD-10-CM | POA: Insufficient documentation

## 2019-08-05 MED ORDER — IBUPROFEN 400 MG PO TABS
600.0000 mg | ORAL_TABLET | Freq: Once | ORAL | Status: AC
  Administered 2019-08-05: 600 mg via ORAL
  Filled 2019-08-05: qty 2

## 2019-08-05 MED ORDER — OXYCODONE-ACETAMINOPHEN 5-325 MG PO TABS
1.0000 | ORAL_TABLET | Freq: Once | ORAL | Status: AC
  Administered 2019-08-05: 1 via ORAL
  Filled 2019-08-05: qty 1

## 2019-08-05 MED ORDER — DEXAMETHASONE 4 MG PO TABS
12.0000 mg | ORAL_TABLET | Freq: Once | ORAL | Status: AC
  Administered 2019-08-05: 12 mg via ORAL
  Filled 2019-08-05: qty 3

## 2019-08-05 NOTE — ED Triage Notes (Signed)
Pt states he accidentally hit his left arm with a metal pipe x11 days ago trying to keep a snake off his puppies; pt states the pain is from his left elbow to his wrist and has some numbness to pinky and ring finger

## 2019-08-05 NOTE — Discharge Instructions (Signed)
Your x-rays do not show any fracture/breaks.  You may wear the sling as needed for comfort.  Take ibuprofen 600 mg every 6 hours as needed for pain/discomfort.  The numbness you are feeling is probably from nerve irritation.  Hopefully this should improve over the next week or so.  If this persists I would recommend follow-up with hand surgery.  Activity as tolerated otherwise.

## 2019-08-05 NOTE — ED Provider Notes (Signed)
[facility] EMERGENCY DEPARTMENT Provider Note   CSN: [id number] Arrival date & time: 08/05/19  0944     History Chief Complaint  Patient presents with  . Arm Pain    Trevor Pacheco is a 34 y.o. male.  HPI   34 year old male with a left elbow and forearm pain.  Patient reports that approximately week and a half ago he accidentally struck himself near the left elbow with a metal pipe.  Initially had a large bruise and some swelling which has since subsided.  He has had persistent pain near his left elbow which is worse with pronation.  He also has some numbness/tingling in his little and ring fingers.  Past Medical History:  Diagnosis Date  . Heart murmur     There are no problems to display for this patient.   Past Surgical History:  Procedure Laterality Date  . ABSCESS DRAINAGE    . HAND SURGERY         History reviewed. No pertinent family history.  Social History   Tobacco Use  . Smoking status: Current Every Day Smoker    Packs/day: 0.25    Types: Cigarettes  . Smokeless tobacco: Never Used  Vaping Use  . Vaping Use: Never used  Substance Use Topics  . Alcohol use: No  . Drug use: Not Currently    Home Medications Prior to Admission medications   Medication Sig Start Date End Date Taking? Authorizing Provider  acetaminophen (TYLENOL) 500 MG tablet Take 1,000 mg by mouth every 6 (six) hours as needed for mild pain or moderate pain.    [provider]  aspirin 325 MG tablet Take 325 mg by mouth every other day.    [provider]  clindamycin (CLEOCIN) 150 MG capsule Take 1 capsule (150 mg total) by mouth every 6 (six) hours. 11/24/17   Ivery Quale, PA-C  ibuprofen (ADVIL,MOTRIN) 600 MG tablet Take 1 tablet (600 mg total) by mouth 4 (four) times daily. 11/24/17   Ivery Quale, PA-C    Allergies    Penicillins  Review of Systems   Review of Systems All systems reviewed and negative, other than as noted in HPI.  Physical  Exam Updated Vital Signs BP 129/73 (BP Location: Right Arm)   Pulse 74   Temp 98.1 F (36.7 C) (Oral)   Resp 18   Ht 6\' 2"  (1.88 m)   Wt 76.2 kg   SpO2 99%   BMI 21.57 kg/m   Physical Exam Vitals and nursing note reviewed.  Constitutional:      General: He is not in acute distress.    Appearance: He is well-developed.  HENT:     Head: Normocephalic and atraumatic.  Eyes:     General:        Right eye: No discharge.        Left eye: No discharge.     Conjunctiva/sclera: Conjunctivae normal.  Cardiovascular:     Rate and Rhythm: Normal rate and regular rhythm.     Heart sounds: Normal heart sounds. No murmur heard.  No friction rub. No gallop.   Pulmonary:     Effort: Pulmonary effort is normal. No respiratory distress.     Breath sounds: Normal breath sounds.  Abdominal:     General: There is no distension.     Palpations: Abdomen is soft.     Tenderness: There is no abdominal tenderness.  Musculoskeletal:     Cervical back: Neck supple.     Comments:  Left upper extremity symmetric as compared to the right.  No swelling appreciated.  No overlying skin changes.  Some tenderness to palpation over the lateral elbow.  Can actively range.  Ports some decreased sensation to light touch in ulnar distribution.  Motor function is intact distally.  Skin:    General: Skin is warm and dry.  Neurological:     Mental Status: He is alert.  Psychiatric:        Behavior: Behavior normal.        Thought Content: Thought content normal.     ED Results / Procedures / Treatments   Labs (all labs ordered are listed, but only abnormal results are displayed) Labs Reviewed - No data to display  EKG None  Radiology DG Elbow Complete Left  Result Date: 08/05/2019 CLINICAL DATA:  Injury. EXAM: LEFT ELBOW - COMPLETE 3+ VIEW COMPARISON:  No recent prior. FINDINGS: No acute bony or joint abnormality. No evidence of effusion. No radiopaque foreign body. IMPRESSION: No acute abnormality.  No  evidence of fracture or dislocation. Electronically Signed   By: Maisie Fus  Register   On: 08/05/2019 10:38   DG Forearm Left  Result Date: 08/05/2019 CLINICAL DATA:  Left arm trauma, left arm pain EXAM: LEFT FOREARM - 2 VIEW COMPARISON:  None. FINDINGS: There is no evidence of fracture or other focal bone lesions. Soft tissues are unremarkable. IMPRESSION: Negative. Electronically Signed   By: Helyn Numbers MD   On: 08/05/2019 10:35    Procedures Procedures (including critical care time)  Medications Ordered in ED Medications  dexamethasone (DECADRON) tablet 12 mg (has no administration in time range)  ibuprofen (ADVIL) tablet 600 mg (600 mg Oral Given 08/05/19 1035)  oxyCODONE-acetaminophen (PERCOCET/ROXICET) 5-325 MG per tablet 1 tablet (1 tablet Oral Given 08/05/19 1035)    ED Course  I have reviewed the triage vital signs and the nursing notes.  Pertinent labs & imaging results that were available during my care of the patient were reviewed by me and considered in my medical decision making (see chart for details).    MDM Rules/Calculators/A&P                          34 year old male with continued pain after accidentally being struck in the elbow with a metal pipe.  Imaging negative.  Describes sensory findings in ulnar distribution although most of his pain is actually laterally along the elbow.  Will place in sling for comfort.  Continued as needed NSAIDs.  Follow-up with hand surgery if symptoms do not resolve in the next week or so.  Final Clinical Impression(s) / ED Diagnoses Final diagnoses:  Left elbow pain    Rx / DC Orders ED Discharge Orders    None       Raeford Razor, MD 08/06/19 [phone]

## 2020-09-27 ENCOUNTER — Telehealth: Payer: Self-pay

## 2020-09-27 NOTE — Telephone Encounter (Signed)
Client recently enrolled with [facility]. Will be establishing primary medical care with [facility]. Appointment secured for 10/11/20 at 1PM. With client's permission will send all appointment information via text message.  Reviewed Social determinants of health with client. Client will need transportation. Will call [facility] [employer] to arrange pick up for appointment on 10/11/20, will text client transportation phone number in case he needs to call back for pick up after appointment. Discussed that [facility] will call the evening prior to his appointment to confirm time of pickup. He states understanding.  Reviewed client's PHQ9 score on 09/26/20 = 18 Client feels that his depression and feeling "down" is based on his medical needs at this time. He is not seeing a mental health provider at this time. He states he has multiple physical issues going on at this time. Client denied SI today, he says he has at times thoughts, but no actual plan and none today. states he would not harm himself. Asked if he had prior attempts  he reports around 17-May-2010 after a death of a family member but none since. Again asked client if he felt he could benefit from having a counselor or someone to talk to regarding his feeling down. He states he just wants someone to figure out what's going on with the "knots on his neck and hip", he states he loses his train of thought often.  He also reports dental need. Discussed discussing dental need with provider and that referral would come back to [facility] and  discussed how dental referrals work  He states understanding.  Plan:  Transportation arranged. Will follow up either in person after his visit or by phone. Client is agreeable. Will again discuss need for any mental health services again at that time.  Francee Nodal RN [employer]

## 2020-10-11 ENCOUNTER — Encounter: Payer: Self-pay | Admitting: Physician Assistant

## 2020-10-11 ENCOUNTER — Ambulatory Visit: Payer: Self-pay | Admitting: Physician Assistant

## 2020-10-11 ENCOUNTER — Other Ambulatory Visit: Payer: Self-pay

## 2020-10-11 VITALS — BP 125/86 | HR 53 | Temp 98.3°F | Ht 71.0 in | Wt 174.0 lb

## 2020-10-11 DIAGNOSIS — F17200 Nicotine dependence, unspecified, uncomplicated: Secondary | ICD-10-CM

## 2020-10-11 DIAGNOSIS — Z7689 Persons encountering health services in other specified circumstances: Secondary | ICD-10-CM

## 2020-10-11 DIAGNOSIS — Z13220 Encounter for screening for lipoid disorders: Secondary | ICD-10-CM

## 2020-10-11 DIAGNOSIS — R4586 Emotional lability: Secondary | ICD-10-CM

## 2020-10-11 DIAGNOSIS — Z131 Encounter for screening for diabetes mellitus: Secondary | ICD-10-CM

## 2020-10-11 DIAGNOSIS — R079 Chest pain, unspecified: Secondary | ICD-10-CM

## 2020-10-11 NOTE — Patient Instructions (Addendum)
  Blood tests/labs  Chest xray  [facility]/mental health  Covid vaccination  [facility] [employer] financial assistance application

## 2020-10-11 NOTE — Progress Notes (Signed)
P   BP 125/86   Pulse (!) 53   Temp 98.3 F (36.8 C)   Ht 5\' 11"  (1.803 m)   Wt 174 lb (78.9 kg)   SpO2 97%   BMI 24.27 kg/m    Subjective:    Patient ID: , male    DOB: 10/30/1985, 35 y.o.   MRN: [id number]  HPI: Trevor Pacheco is a 35 y.o. male presenting on 10/11/2020 for No chief complaint on file.   HPI   Pt had a negative covid 19 screening questionnaire.    Pt is 35yoM who presents to establish care.     He has had No PCP since his pediatrician  Pt hasn't worked in 5 years.  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  Pt says he has never gotten MH care in the past.  He lives with his sister.    He hasn't gotten covid vaccination he says due to no transportation.   He says throat surgery at [facility]. In 2018.   From records looks like dental extraction and submandibular abscess.    It was noted that pt was crying at his appointment 12/01/16 due to stress.  Pt reports knot in groin since 2012 and one in neck since 2018/2019.    He reports Constant pain in chest for 2 years.   No sob.  No known injury.  Pt is somewhat of a difficult historian      Relevant past medical, surgical, family and social history reviewed and updated as indicated. Interim medical history since our last visit reviewed. Allergies and medications reviewed and updated.   Current Outpatient Medications:    acetaminophen (TYLENOL) 500 MG tablet, Take 1,000 mg by mouth every 6 (six) hours as needed for mild pain or moderate pain., Disp: , Rfl:    aspirin 325 MG tablet, Take 325 mg by mouth every other day., Disp: , Rfl:    ibuprofen (ADVIL,MOTRIN) 600 MG tablet, Take 1 tablet (600 mg total) by mouth 4 (four) times daily., Disp: 30 tablet, Rfl: 0    Review of Systems  Per HPI unless specifically indicated above     Objective:    BP 125/86   Pulse (!) 53   Temp 98.3 F (36.8 C)   Ht 5\' 11"  (1.803 m)   Wt 174 lb (78.9 kg)   SpO2 97%   BMI 24.27 kg/m   Wt  Readings from Last 3 Encounters:  10/11/20 174 lb (78.9 kg)  08/05/19 168 lb (76.2 kg)  11/24/17 168 lb (76.2 kg)    Physical Exam Vitals reviewed. Exam conducted with a chaperone present.  Constitutional:      General: He is not in acute distress.    Appearance: He is well-developed. He is not toxic-appearing.  HENT:     Head: Normocephalic and atraumatic.  Eyes:     Extraocular Movements: Extraocular movements intact.     Conjunctiva/sclera: Conjunctivae normal.     Pupils: Pupils are equal, round, and reactive to light.  Neck:     Thyroid: No thyromegaly.  Cardiovascular:     Rate and Rhythm: Normal rate and regular rhythm.  Pulmonary:     Effort: Pulmonary effort is normal.     Breath sounds: Normal breath sounds. No wheezing or rales.  Chest:     Chest wall: Tenderness present. No mass or deformity.  Abdominal:     General: Bowel sounds are normal.     Palpations: Abdomen is soft.  There is no mass.     Tenderness: There is no abdominal tenderness.  Genitourinary:    Comments: No palpable knot in groin Musculoskeletal:     Cervical back: Neck supple.     Right lower leg: No edema.     Left lower leg: No edema.  Lymphadenopathy:     Cervical: No cervical adenopathy.  Skin:    General: Skin is warm and dry.     Findings: No rash.  Neurological:     Mental Status: He is alert and oriented to person, place, and time.     Motor: No weakness or tremor.     Gait: Gait is intact.  Psychiatric:        Mood and Affect: Affect is tearful.        Behavior: Behavior is cooperative.     Comments: Tearful at times    No results found for this or any previous visit.    Assessment & Plan:    Encounter Diagnoses  Name Primary?   Encounter to establish care Yes   Chest pain, unspecified type    Tobacco use disorder    Mood and affect disturbance    Screening cholesterol level    Screening for diabetes mellitus       -will get Baseline labs and a Cxr -pt encouraged  to contact [facility] for counseling -pt is given application for [facility] [employer] financial assistance -pt was encouraged to get covid vaccination.  Discussed that [facility] can help with transportation if needed -pt to follow up 3 or 4 weeks.  He is to contact office sooner prn

## 2020-10-15 ENCOUNTER — Other Ambulatory Visit (HOSPITAL_COMMUNITY)
Admission: RE | Admit: 2020-10-15 | Discharge: 2020-10-15 | Disposition: A | Discharge status: Home / Self Care | Payer: Self-pay | Source: Ambulatory Visit | Attending: Physician Assistant | Admitting: Physician Assistant

## 2020-10-15 ENCOUNTER — Other Ambulatory Visit: Payer: Self-pay

## 2020-10-15 ENCOUNTER — Ambulatory Visit (HOSPITAL_COMMUNITY)
Admission: RE | Admit: 2020-10-15 | Discharge: 2020-10-15 | Disposition: A | Discharge status: Home / Self Care | Payer: Self-pay | Source: Ambulatory Visit | Attending: Physician Assistant | Admitting: Physician Assistant

## 2020-10-15 DIAGNOSIS — R079 Chest pain, unspecified: Secondary | ICD-10-CM

## 2020-10-15 DIAGNOSIS — Z131 Encounter for screening for diabetes mellitus: Secondary | ICD-10-CM

## 2020-10-15 DIAGNOSIS — Z13220 Encounter for screening for lipoid disorders: Secondary | ICD-10-CM | POA: Insufficient documentation

## 2020-10-15 DIAGNOSIS — R69 Illness, unspecified: Secondary | ICD-10-CM | POA: Insufficient documentation

## 2020-10-15 LAB — COMPREHENSIVE METABOLIC PANEL
ALT: 49 U/L — ABNORMAL HIGH (ref 0–44)
AST: 30 U/L (ref 15–41)
Albumin: 4.4 g/dL (ref 3.5–5.0)
Alkaline Phosphatase: 42 U/L (ref 38–126)
Anion gap: 10 (ref 5–15)
BUN: 7 mg/dL (ref 6–20)
CO2: 25 mmol/L (ref 22–32)
Calcium: 9.3 mg/dL (ref 8.9–10.3)
Chloride: 105 mmol/L (ref 98–111)
Creatinine, Ser: 0.79 mg/dL (ref 0.61–1.24)
GFR, Estimated: >60 mL/min (ref >60)
Glucose, Bld: 104 mg/dL — ABNORMAL HIGH (ref 70–99)
Potassium: 4.7 mmol/L (ref 3.5–5.1)
Sodium: 140 mmol/L (ref 135–145)
Total Bilirubin: 1.1 mg/dL (ref 0.3–1.2)
Total Protein: 7.6 g/dL (ref 6.5–8.1)

## 2020-10-15 LAB — HEMOGLOBIN A1C
Hgb A1c MFr Bld: 4.7 % — ABNORMAL LOW (ref 4.8–5.6)
Mean Plasma Glucose: 88.19 mg/dL

## 2020-10-15 LAB — LIPID PANEL
Cholesterol: 176 mg/dL (ref 0–200)
HDL: 30 mg/dL — ABNORMAL LOW (ref >40)
LDL Cholesterol: 121 mg/dL — ABNORMAL HIGH (ref 0–99)
Total CHOL/HDL Ratio: 5.9 RATIO
Triglycerides: 127 mg/dL (ref <150)
VLDL: 25 mg/dL (ref 0–40)

## 2020-11-07 ENCOUNTER — Encounter: Payer: Self-pay | Admitting: Physician Assistant

## 2020-11-07 ENCOUNTER — Ambulatory Visit: Payer: Self-pay | Admitting: Physician Assistant

## 2020-11-07 VITALS — BP 110/80 | HR 92 | Temp 97.9°F | Wt 177.0 lb

## 2020-11-07 DIAGNOSIS — F17200 Nicotine dependence, unspecified, uncomplicated: Secondary | ICD-10-CM

## 2020-11-07 DIAGNOSIS — R0789 Other chest pain: Secondary | ICD-10-CM

## 2020-11-07 DIAGNOSIS — M25512 Pain in left shoulder: Secondary | ICD-10-CM

## 2020-11-07 DIAGNOSIS — R4586 Emotional lability: Secondary | ICD-10-CM

## 2020-11-07 NOTE — Progress Notes (Signed)
BP 110/80   Pulse 92   Temp 97.9 F (36.6 C)   Wt 177 lb (80.3 kg)   SpO2 98%   BMI 24.69 kg/m    Subjective:    Patient ID: Trevor Pacheco, male    DOB: 1985/05/09, 35 y.o.   MRN: [id number]  HPI: Trevor Pacheco is a 35 y.o. male presenting on 11/07/2020 for Follow-up   HPI   Pt had a negative covid 19 screening questionnaire.   Pt is 35yoM who presents for follow of new patient appointment 10/11/20.    He has been to [facility] and has follow up appointment later this month.  He got covid vaccination earlier this week.    Pt says he continues to have chest pain as reported at new patient appointment that has been going on for 2 years without change.  It makes it difficult for him to use his left arm.  He has not yet submitted his cafa that he was given to cover his CXR.       Relevant past medical, surgical, family and social history reviewed and updated as indicated. Interim medical history since our last visit reviewed. Allergies and medications reviewed and updated.   Current Outpatient Medications:    acetaminophen (TYLENOL) 500 MG tablet, Take 1,000 mg by mouth every 6 (six) hours as needed for mild pain or moderate pain., Disp: , Rfl:    aspirin 325 MG tablet, Take 325 mg by mouth every other day., Disp: , Rfl:    ibuprofen (ADVIL,MOTRIN) 600 MG tablet, Take 1 tablet (600 mg total) by mouth 4 (four) times daily., Disp: 30 tablet, Rfl: 0   Review of Systems  Per HPI unless specifically indicated above     Objective:    BP 110/80   Pulse 92   Temp 97.9 F (36.6 C)   Wt 177 lb (80.3 kg)   SpO2 98%   BMI 24.69 kg/m   Wt Readings from Last 3 Encounters:  11/07/20 177 lb (80.3 kg)  10/11/20 174 lb (78.9 kg)  08/05/19 168 lb (76.2 kg)    Physical Exam Vitals reviewed.  Constitutional:      General: He is not in acute distress.    Appearance: He is well-developed. He is not ill-appearing.     Comments: Disheveled, body odor, strong cigarette  odor  HENT:     Head: Normocephalic and atraumatic.  Cardiovascular:     Rate and Rhythm: Normal rate and regular rhythm.  Pulmonary:     Effort: Pulmonary effort is normal.     Breath sounds: Normal breath sounds. No wheezing.  Chest:     Chest wall: Tenderness present.       Comments: Mild non-point tenderness left anterior and lateral chest wall Abdominal:     General: Bowel sounds are normal.     Palpations: Abdomen is soft.     Tenderness: There is no abdominal tenderness.  Musculoskeletal:     Cervical back: Neck supple.  Lymphadenopathy:     Cervical: No cervical adenopathy.  Skin:    General: Skin is warm and dry.  Neurological:     Mental Status: He is alert and oriented to person, place, and time.  Psychiatric:        Mood and Affect: Mood is anxious.        Behavior: Behavior is cooperative.    Results for orders placed or performed during the hospital encounter of 10/15/20  Hemoglobin A1c  Result Value Ref  Range   Hgb A1c MFr Bld 4.7 (L) 4.8 - 5.6 %   Mean Plasma Glucose 88.19 mg/dL  Lipid panel  Result Value Ref Range   Cholesterol 176 0 - 200 mg/dL   Triglycerides 242 <353 mg/dL   HDL 30 (L) >61 mg/dL   Total CHOL/HDL Ratio 5.9 RATIO   VLDL 25 0 - 40 mg/dL   LDL Cholesterol 443 (H) 0 - 99 mg/dL  Comprehensive metabolic panel  Result Value Ref Range   Sodium 140 135 - 145 mmol/L   Potassium 4.7 3.5 - 5.1 mmol/L   Chloride 105 98 - 111 mmol/L   CO2 25 22 - 32 mmol/L   Glucose, Bld 104 (H) 70 - 99 mg/dL   BUN 7 6 - 20 mg/dL   Creatinine, Ser 1.54 0.61 - 1.24 mg/dL   Calcium 9.3 8.9 - 00.8 mg/dL   Total Protein 7.6 6.5 - 8.1 g/dL   Albumin 4.4 3.5 - 5.0 g/dL   AST 30 15 - 41 U/L   ALT 49 (H) 0 - 44 U/L   Alkaline Phosphatase 42 38 - 126 U/L   Total Bilirubin 1.1 0.3 - 1.2 mg/dL   GFR, Estimated >67 >61 mL/min   Anion gap 10 5 - 15      Assessment & Plan:    Encounter Diagnoses  Name Primary?   Mood and affect disturbance Yes   Tobacco use  disorder    Chest wall pain        -Reviewed labs and CXR with pt -encouraged Lowfat diet for mildly elevated lipids -Encouraged 2nd covid vaccination when it is due.  Spent 5 minutes discussing with pt how he can get this (ie where, transportation options timing, etc) -pt to Continue with [facility] for MH issues -discussed chest wall pain musculoskeletal.  He is encouraged to use ice and/or heat to help with discomfort.  He can use IBU or APAP as well.  Discussed gradual return to normal functioning -Refer to physical therapy for LUE deconditioning due to not using it as it worsened his chest pain -pt is given another application for [facility] [employer] financial assistance and is encouraged him to get it submitted to cover cost of CXR and PT -pt to follow up august/one year for routine care.  He is to contact office sooner prn

## 2020-11-07 NOTE — Patient Instructions (Signed)

## 2020-11-14 ENCOUNTER — Other Ambulatory Visit: Payer: Self-pay

## 2020-11-14 ENCOUNTER — Ambulatory Visit: Payer: Self-pay | Attending: Physician Assistant

## 2020-11-14 DIAGNOSIS — M6281 Muscle weakness (generalized): Secondary | ICD-10-CM | POA: Insufficient documentation

## 2020-11-14 DIAGNOSIS — M542 Cervicalgia: Secondary | ICD-10-CM | POA: Insufficient documentation

## 2020-11-14 DIAGNOSIS — R0789 Other chest pain: Secondary | ICD-10-CM | POA: Insufficient documentation

## 2020-11-14 DIAGNOSIS — G8929 Other chronic pain: Secondary | ICD-10-CM | POA: Insufficient documentation

## 2020-11-14 DIAGNOSIS — M25512 Pain in left shoulder: Secondary | ICD-10-CM | POA: Insufficient documentation

## 2020-11-14 NOTE — Therapy (Addendum)
[facility] [place], [place], [address] Phone: [phone]   Fax:  [phone]  Physical Therapy Evaluation  Patient Details  Name: Trevor Pacheco MRN: [id number] Date of Birth: Apr 10, 1985 Referring Provider (PT): Roslynn Amble   Encounter Date: 11/14/2020   PT End of Session - 11/14/20 1104     Visit Number 1    Number of Visits 6    Date for PT Re-Evaluation 12/07/20    PT Start Time 1115    PT Stop Time 1201    PT Time Calculation (min) 46 min    Activity Tolerance Patient limited by pain    Behavior During Therapy [facility] for tasks assessed/performed             Past Medical History:  Diagnosis Date   Heart murmur     Past Surgical History:  Procedure Laterality Date   ABSCESS DRAINAGE     HAND SURGERY      There were no vitals filed for this visit.    Subjective Assessment - 11/14/20 1116     Subjective Patient reports that he had surgery on his throat about 5 years ago. He then feels that his chest began hurting shortly afterward. He feels that it has been getting worse over the last 2 years. He feels that it is primarily tension in his chest up to his head. He then feels that he will get dizzy and "black out." He has been limiting himself to prevent from aggravating his symptoms. He feels that it can take up to 45-60 minutes before it really starts bothering him. He feels that therapy will not help and he needs additional imaging.    Pertinent History throat surgery (>5 years ago)    How long can you walk comfortably? 4-5 minutes prior to fatigue    Diagnostic tests x-ray,    Patient Stated Goals return to work, be able to walk for more that 4-5 minutes    Currently in Pain? Yes    Pain Score 5     Pain Location Chest    Pain Orientation Left    Pain Descriptors / Indicators Tightness;Sharp    Pain Type Chronic pain    Pain Radiating Towards up to left shoulder and left side of his head    Pain Onset  More than a month ago    Pain Frequency Constant    Aggravating Factors  overuse, stress    Pain Relieving Factors laying down    Effect of Pain on Daily Activities he limits himself to prevent from aggravating his symptoms                [facility] PT Assessment - 11/14/20 0001       Assessment   Medical Diagnosis Chest wall and Left Shoulder Pain    Referring Provider (PT) Mc[place]    Onset Date/Surgical Date --   more than 5 years ago   Hand Dominance Right    Next MD Visit --   August 2023   Prior Therapy No      Precautions   Precautions None      Balance Screen   Has the patient fallen in the past 6 months No    Has the patient had a decrease in activity level because of a fear of falling?  No    Is the patient reluctant to leave their home because of a fear of falling?  No      Home Environment  Living Environment Private residence    Living Arrangements Other relatives      Prior Function   Level of Independence Independent    Vocation Unemployed      Cognition   Overall Cognitive Status Within Functional Limits for tasks assessed    Attention Focused    Focused Attention Appears intact    Memory Appears intact    Awareness Appears intact    Problem Solving Appears intact      Sensation   Additional Comments None reported      ROM / Strength   AROM / PROM / Strength AROM;Strength      AROM   Overall AROM Comments R shoulder: WFL Cervical: WFL   mild tension with L>R with cervical rotation and sidebending   AROM Assessment Site Shoulder    Right/Left Shoulder Left    Left Shoulder Flexion --   WFL   Left Shoulder ABduction 85 Degrees   prior to being limited by familiar tightness     Strength   Strength Assessment Site Shoulder;Elbow    Right/Left Shoulder Right;Left    Right Shoulder Flexion 4/5    Right Shoulder ABduction 4+/5    Left Shoulder Flexion 3+/5   familiar chest tightness   Left Shoulder ABduction 4/5   familiar chest and shoulder pain    Right/Left Elbow Right;Left    Right Elbow Flexion 4+/5    Right Elbow Extension 4/5    Left Elbow Flexion 4-/5    Left Elbow Extension 4-/5      Palpation   Palpation comment Tender to palpation   Left upper trapezuis (familiar tightness), levator scapulae (tightness), subscapularis (tightness), deltoid (sore), cervical paraspinals (tightness), pectoralis major and minor (tightness) periscapular musculature (tightness and referred pain)                       Objective measurements completed on examination: See above findings.       [facility] Adult PT Treatment/Exercise - 11/14/20 0001       Exercises   Exercises Shoulder      Shoulder Exercises: Seated   Retraction AROM;12 reps;Both      Shoulder Exercises: ROM/Strengthening   Pendulum AP x 1.5 min      Shoulder Exercises: Stretch   Other Shoulder Stretches Upper trapezius   3 x 30 seconds                    PT Education - 11/14/20 1227     Education Details follow up with referring provider regarding referral for a specialist    Person(s) Educated Patient    Methods Explanation    Comprehension Need further instruction              PT Short Term Goals - 11/14/20 1232       PT SHORT TERM GOAL #1   Title --    Baseline --    Time --               PT Long Term Goals - 11/14/20 1233       PT LONG TERM GOAL #1   Title Patient will be able to complete his daily activities with 3/10 pain or less.    Baseline 5/10    Time 3    Period Weeks    Status New    Target Date 12/05/20      PT LONG TERM GOAL #2   Title Patient will be independent with  his HEP.    Time 3    Period Weeks    Status New    Target Date 12/05/20      PT LONG TERM GOAL #3   Title Patient will be able to walk for at least 10 minutes without being limited by his familiar symptoms.    Baseline 5    Time 3    Period Weeks    Status New    Target Date 12/05/20                    Plan -  11/14/20 1104     Clinical Impression Statement Patient is a 35 year old male presenting to physical therapy with left sided chest, shoulder, and cervical pain with no known cause. He presented today with moderate pain severity and high irritability as this limited his ability to complete light active range of motion interventions. He may benefit from skilled physical therapy to address his remaining impairments to return to his prior level of function.    Personal Factors and Comorbidities Other;Behavior Pattern;Time since onset of injury/illness/exacerbation    Examination-Activity Limitations Reach Overhead;Carry;Lift    Examination-Participation Restrictions Community Activity;Occupation    Stability/Clinical Decision Making Unstable/Unpredictable    Clinical Decision Making High    Rehab Potential Fair    PT Frequency 2x / week    PT Duration 3 weeks    PT Treatment/Interventions Electrical Stimulation;Moist Heat;Neuromuscular re-education;Therapeutic exercise;Therapeutic activities;Patient/family education;Manual techniques;Energy conservation;Passive range of motion;Taping;Vasopneumatic Device    PT Next Visit Plan light AROM and AAROM, modalities as needed    Consulted and Agree with Plan of Care Patient             Patient will benefit from skilled therapeutic intervention in order to improve the following deficits and impairments:  Decreased range of motion, Decreased activity tolerance, Pain, Decreased strength  Visit Diagnosis: Chronic left shoulder pain  Cervicalgia  Muscle weakness (generalized)     Problem List There are no problems to display for this patient.   Darlin Coco, PT 11/14/2020, 12:39 PM  [facility] [place], [place], [address] Phone: [phone]   Fax:  [phone]  Name: MAXAMILLIAN TIENDA MRN: [id number] Date of Birth: [birth year]  PHYSICAL THERAPY DISCHARGE SUMMARY  Visits from  Start of Care: 1  Current functional level related to goals / functional outcomes: Patient was referred back to his referring provider for additional imaging and assessments.    Remaining deficits: No change   Education / Equipment: Contacting his referring physician   Patient agrees to discharge. Patient goals were not met. Patient is being discharged due to  his medical status for additional testing.

## 2020-11-19 ENCOUNTER — Other Ambulatory Visit: Payer: Self-pay

## 2020-11-19 ENCOUNTER — Ambulatory Visit: Payer: Self-pay | Admitting: *Deleted

## 2020-11-19 NOTE — Therapy (Signed)
[facility] [address] [place], [place], [address] Phone: [phone]   Fax:  [phone]  Patient Details  Name: Trevor Pacheco MRN: [id number] Date of Birth: Jun 08, 1985 Referring Provider:  Jacquelin Hawking, PA-C  Encounter Date: 11/19/2020   [place] Delauder,CHRIS, PTA 11/19/2020, 10:58 AM  [facility] [address] [place], [place], [address] Phone: [phone]   Fax:  [phone]

## 2020-11-20 ENCOUNTER — Telehealth: Payer: Self-pay

## 2020-11-20 NOTE — Telephone Encounter (Signed)
Spoke with pt in regards to PT being d/c & informed of Ortho referral for pain, asked pt about cafa application & he stated he will mail tomorrow as he just found it, pt declined counseling services as he is already attending weekly "anxiety classes" set up through [facility].

## 2020-11-26 ENCOUNTER — Ambulatory Visit (HOSPITAL_COMMUNITY): Payer: Self-pay | Admitting: Physical Therapy

## 2020-12-24 ENCOUNTER — Encounter: Payer: Self-pay | Admitting: Physician Assistant

## 2020-12-24 ENCOUNTER — Ambulatory Visit: Payer: Self-pay | Admitting: Physician Assistant

## 2020-12-24 ENCOUNTER — Other Ambulatory Visit: Payer: Self-pay

## 2020-12-24 VITALS — BP 131/80 | HR 81 | Temp 98.6°F | Wt 174.0 lb

## 2020-12-24 DIAGNOSIS — R079 Chest pain, unspecified: Secondary | ICD-10-CM

## 2020-12-24 DIAGNOSIS — F17200 Nicotine dependence, unspecified, uncomplicated: Secondary | ICD-10-CM

## 2020-12-24 NOTE — Progress Notes (Signed)
BP 131/80   Pulse 81   Temp 98.6 F (37 C)   Wt 174 lb (78.9 kg)   SpO2 97%   BMI 24.27 kg/m    Subjective:    Patient ID: Trevor Pacheco, male    DOB: Feb 01, 1985, 35 y.o.   MRN: [id number]  HPI: Trevor Pacheco is a 35 y.o. male presenting on 12/24/2020 for Follow-up   HPI     Pt is 35yoM who presented to establish care 10/11/20.    At that time pt reported pains in chest for two years that was constant but no sob.  Pt was noted to be difficult historian.  He was also noted to be tearful and was encouraged to contact MH for stress and anxiety.  Pt was seen for follow up in October.  At that time he reported that his chest pain made it difficult for him to use his left arm.       Today Pt says he had several episodes of LOC but none in the past 5 months.   He has not previously reported that he had syncopal episodes.  Pt says he went to [facility] and was told he is normal and doesn't need to return to [facility].  He continues to smoke.  He says he submitted cafa but he hasn't heard if he got approved  Pt had normal CXR.  Labs unremarkable other than very mildly elevated LDL.  Pt was referred to PT for Left shoulder pain and he went one time and declined to return for further sessions.    Pt says he has a lump left neck that he thinks is related to making the pain go up from his chest and into his head.    Relevant past medical, surgical, family and social history reviewed and updated as indicated. Interim medical history since our last visit reviewed. Allergies and medications reviewed and updated.    Current Outpatient Medications:    acetaminophen (TYLENOL) 500 MG tablet, Take 1,000 mg by mouth every 6 (six) hours as needed for mild pain or moderate pain., Disp: , Rfl:    aspirin 325 MG tablet, Take 325 mg by mouth every other day., Disp: , Rfl:    ibuprofen (ADVIL,MOTRIN) 600 MG tablet, Take 1 tablet (600 mg total) by mouth 4 (four) times daily., Disp: 30 tablet, Rfl:  0     Review of Systems  Per HPI unless specifically indicated above     Objective:    BP 131/80   Pulse 81   Temp 98.6 F (37 C)   Wt 174 lb (78.9 kg)   SpO2 97%   BMI 24.27 kg/m   Wt Readings from Last 3 Encounters:  12/24/20 174 lb (78.9 kg)  11/07/20 177 lb (80.3 kg)  10/11/20 174 lb (78.9 kg)    RR: 20  Physical Exam Vitals reviewed.  Constitutional:      General: He is not in acute distress.    Appearance: He is well-developed. He is not ill-appearing.     Comments: Disheveled. Body odor.   HENT:     Head: Normocephalic and atraumatic.  Neck:     Comments: Lump that pt reports is a tiny, < 24mm lymph node which is soft mobile and nontender and entirely without abnormal findings on exam Cardiovascular:     Rate and Rhythm: Normal rate and regular rhythm.     Heart sounds: No murmur heard. Pulmonary:     Effort: Pulmonary effort is normal.  No respiratory distress.     Breath sounds: Normal breath sounds. No wheezing.  Chest:     Chest wall: Tenderness present. No mass, deformity, swelling or crepitus.     Comments: Mild tenderness left lateral chest wall Abdominal:     General: Bowel sounds are normal.     Palpations: Abdomen is soft.     Tenderness: There is no abdominal tenderness.  Musculoskeletal:     Right shoulder: No tenderness. Normal range of motion.     Left shoulder: No tenderness. Normal range of motion.     Cervical back: Neck supple.     Right lower leg: No edema.     Left lower leg: No edema.  Lymphadenopathy:     Cervical: No cervical adenopathy.  Skin:    General: Skin is warm and dry.  Neurological:     Mental Status: He is alert and oriented to person, place, and time.     Motor: No weakness or tremor.     Gait: Gait is intact.  Psychiatric:        Attention and Perception: Attention normal.        Mood and Affect: Mood is anxious. Affect is not inappropriate.        Behavior: Behavior is cooperative.     Comments: Pt is much  calmer than at previous appointments but is still anxious      EKG- sinus arrhythmia.  No ST-T changes.  No previous for comparison.       Assessment & Plan:    Encounter Diagnoses  Name Primary?   Chest pain, unspecified type Yes   Tobacco use disorder       -suspect anxiety cause of issues but in light of today reporting syncopal episodes (although they were months ago) and pt declining to return to PT, will refer to cardiology for evaluation  -will have [facility] contact pt re counseling -pt is reassured that lymph node left neck is not abnormal -pt to return for follow up a scheduled.  He is to contact office for new symptoms or worsening

## 2020-12-25 ENCOUNTER — Telehealth: Payer: Self-pay | Admitting: Licensed Clinical Social Worker

## 2020-12-25 NOTE — Telephone Encounter (Signed)
[facility] reached patient and first [facility] appointment was scheduled for 12/7 at 9 am.

## 2020-12-30 NOTE — Progress Notes (Signed)
Cardiology Office Note:    Date:  01/03/2021   ID:  Trevor Pacheco, DOB [birth year], MRN [id number]  PCP:  Jacquelin Hawking, PA-C  Cardiologist:  Little Ishikawa, MD  Electrophysiologist:  None   Referring MD: Jacquelin Hawking, PA-C   Chief Complaint  Patient presents with   Chest Pain    History of Present Illness:    Trevor Pacheco is a 35 y.o. male with a hx of tobacco use who is referred by Jacquelin Hawking, PA for evaluation of chest pain.  He reports he is having exertional chest pain.  Describes pain as tightness, occurs with minimal exertion.  Reports if he overexerts himself the tightness will be intense and radiate to neck and will feel lightheaded, has even had syncopal episodes from this.  Last episode of syncope was 6 months ago.  He does report rare palpitations, none recently.  He started smoking at age 23, was up to 0.75 packs/day, currently about 0.25 packs/day.  No known family history of heart disease.   Past Medical History:  Diagnosis Date   Heart murmur     Past Surgical History:  Procedure Laterality Date   ABSCESS DRAINAGE     HAND SURGERY      Current Medications: Current Meds  Medication Sig   acetaminophen (TYLENOL) 500 MG tablet Take 1,000 mg by mouth every 6 (six) hours as needed for mild pain or moderate pain.   aspirin 325 MG tablet Take 325 mg by mouth every other day.   ibuprofen (ADVIL,MOTRIN) 600 MG tablet Take 1 tablet (600 mg total) by mouth 4 (four) times daily.   metoprolol tartrate (LOPRESSOR) 50 MG tablet Take 50 mg 2 hours before cardiac ct     Allergies:   Clindamycin/lincomycin and Penicillins   Social History   Socioeconomic History   Marital status: Single    Spouse name: Not on file   Number of children: Not on file   Years of education: Not on file   Highest education level: Not on file  Occupational History   Not on file  Tobacco Use   Smoking status: Every Day    Packs/day: 0.25    Types: Cigarettes    Smokeless tobacco: Never  Vaping Use   Vaping Use: Never used  Substance and Sexual Activity   Alcohol use: Not Currently    Comment: hx heavy etoh.  none since age 60   Drug use: Yes    Types: Marijuana   Sexual activity: Not on file  Other Topics Concern   Not on file  Social History Narrative   Not on file   Social Determinants of Health   Financial Resource Strain: Not on file  Food Insecurity: Not on file  Transportation Needs: Not on file  Physical Activity: Not on file  Stress: Not on file  Social Connections: Not on file     Family History: The patient's family history includes Alcoholism in his father; Cirrhosis in his father; Diabetes in his maternal aunt.  ROS:   Please see the history of present illness.     All other systems reviewed and are negative.  EKGs/Labs/Other Studies Reviewed:    The following studies were reviewed today:   EKG:  EKG is  ordered today.  The ekg ordered today demonstrates sinus rhythm with sinus arrhythmia, rate 66, no ST abnormalities  Recent Labs: 10/15/2020: ALT 49; BUN 7; Creatinine, Ser 0.79; Potassium 4.7; Sodium 140  Recent Lipid Panel    Component  Value Date/Time   CHOL 176 10/15/2020 1014   TRIG 127 10/15/2020 1014   HDL 30 (L) 10/15/2020 1014   CHOLHDL 5.9 10/15/2020 1014   VLDL 25 10/15/2020 1014   LDLCALC 121 (H) 10/15/2020 1014    Physical Exam:    VS:  BP 126/80   Pulse 66   Ht 6\' 1"  (1.854 m)   Wt 170 lb 3.2 oz (77.2 kg)   SpO2 97%   BMI 22.46 kg/m     Wt Readings from Last 3 Encounters:  01/03/21 170 lb 3.2 oz (77.2 kg)  12/24/20 174 lb (78.9 kg)  11/07/20 177 lb (80.3 kg)     GEN:  Well nourished, well developed in no acute distress HEENT: Normal NECK: No JVD; No carotid bruits LYMPHATICS: No lymphadenopathy CARDIAC: RRR, no murmurs, rubs, gallops RESPIRATORY:  Clear to auscultation without rales, wheezing or rhonchi  ABDOMEN: Soft, non-tender, non-distended MUSCULOSKELETAL:  No edema; No  deformity  SKIN: Warm and dry NEUROLOGIC:  Alert and oriented x 3 PSYCHIATRIC:  Normal affect   ASSESSMENT:    1. Chest pain, unspecified type   2. Syncope, unspecified syncope type    PLAN:    Chest pain: Description is concerning for angina, as describes exertional chest tightness.  While age would suggest less likely obstructive CAD, given CAD risk factors (almost 20-year smoking history) and given description of chest pain, recommend evaluation to rule out obstructive CAD -Recommend coronary CTA.  Will give metoprolol 50 mg prior to study -Echocardiogram  Syncope: Reports has had syncopal episodes with exertion.  Evaluate for obstructive CAD with coronary CTA as above.  Echocardiogram to rule out structural heart disease as above  RTC in 3 months   Medication Adjustments/Labs and Tests Ordered: Current medicines are reviewed at length with the patient today.  Concerns regarding medicines are outlined above.  Orders Placed This Encounter  Procedures   CT CORONARY MORPH W/CTA COR W/SCORE W/CA W/CM &/OR WO/CM   Basic metabolic panel   EKG 12-Lead   ECHOCARDIOGRAM COMPLETE   Meds ordered this encounter  Medications   metoprolol tartrate (LOPRESSOR) 50 MG tablet    Sig: Take 50 mg 2 hours before cardiac ct    Dispense:  1 tablet    Refill:  0    Patient Instructions  Medication Instructions:   Take Lopressor 50 mg 2 hours before Cardiac CT   Labwork:  Bmet 1 week before CT  Testing/Procedures: Your physician has requested that you have an echocardiogram. Echocardiography is a painless test that uses sound waves to create images of your heart. It provides your doctor with information about the size and shape of your heart and how well your heart's chambers and valves are working. This procedure takes approximately one hour. There are no restrictions for this procedure.    Your physician has requested that you have cardiac CT. Cardiac computed tomography (CT) is a  painless test that uses an x-ray machine to take clear, detailed pictures of your heart. For further information please visit 01/07/21. Please follow instruction sheet as given.    Follow-Up: 3 months   Any Other Special Instructions Will Be Listed Below (If Applicable).  If you need a refill on your cardiac medications before your next appointment, please call your pharmacy.   Signed, [email or web address] MD  01/03/2021 10:07 AM    [facility]

## 2021-01-02 ENCOUNTER — Ambulatory Visit: Payer: Self-pay | Admitting: Licensed Clinical Social Worker

## 2021-01-02 ENCOUNTER — Other Ambulatory Visit: Payer: Self-pay

## 2021-01-02 DIAGNOSIS — F419 Anxiety disorder, unspecified: Secondary | ICD-10-CM

## 2021-01-02 NOTE — Progress Notes (Signed)
[facility] engaged patient in initial [facility] session. [facility] provided reflective listening and validation as patient shared about anxiety related to health issues and past traumas. [facility] reviewed patient's current coping strategies and led patient in meditation. Patient declined scheduling follow-up appointment at this time.

## 2021-01-03 ENCOUNTER — Ambulatory Visit (INDEPENDENT_AMBULATORY_CARE_PROVIDER_SITE_OTHER): Payer: Self-pay | Admitting: Cardiology

## 2021-01-03 ENCOUNTER — Encounter: Payer: Self-pay | Admitting: Cardiology

## 2021-01-03 VITALS — BP 126/80 | HR 66 | Ht 73.0 in | Wt 170.2 lb

## 2021-01-03 DIAGNOSIS — R55 Syncope and collapse: Secondary | ICD-10-CM

## 2021-01-03 DIAGNOSIS — R079 Chest pain, unspecified: Secondary | ICD-10-CM

## 2021-01-03 MED ORDER — METOPROLOL TARTRATE 50 MG PO TABS: ORAL_TABLET | ORAL | 0 refills | Status: DC

## 2021-01-03 NOTE — Patient Instructions (Signed)
Medication Instructions:   Take Lopressor 50 mg 2 hours before Cardiac CT   Labwork:  Bmet 1 week before CT  Testing/Procedures: Your physician has requested that you have an echocardiogram. Echocardiography is a painless test that uses sound waves to create images of your heart. It provides your doctor with information about the size and shape of your heart and how well your heart's chambers and valves are working. This procedure takes approximately one hour. There are no restrictions for this procedure.    Your physician has requested that you have cardiac CT. Cardiac computed tomography (CT) is a painless test that uses an x-ray machine to take clear, detailed pictures of your heart. For further information please visit [email or web address] Please follow instruction sheet as given.    Follow-Up: 3 months   Any Other Special Instructions Will Be Listed Below (If Applicable).  If you need a refill on your cardiac medications before your next appointment, please call your pharmacy.

## 2021-01-08 ENCOUNTER — Other Ambulatory Visit (HOSPITAL_COMMUNITY)
Admission: RE | Admit: 2021-01-08 | Discharge: 2021-01-08 | Disposition: A | Discharge status: Home / Self Care | Payer: Self-pay | Source: Ambulatory Visit | Attending: Cardiology | Admitting: Cardiology

## 2021-01-08 DIAGNOSIS — R079 Chest pain, unspecified: Secondary | ICD-10-CM | POA: Insufficient documentation

## 2021-01-08 LAB — BASIC METABOLIC PANEL
Anion gap: 8 (ref 5–15)
BUN: 12 mg/dL (ref 6–20)
CO2: 22 mmol/L (ref 22–32)
Calcium: 9.2 mg/dL (ref 8.9–10.3)
Chloride: 108 mmol/L (ref 98–111)
Creatinine, Ser: 0.74 mg/dL (ref 0.61–1.24)
GFR, Estimated: >60 mL/min (ref >60)
Glucose, Bld: 107 mg/dL — ABNORMAL HIGH (ref 70–99)
Potassium: 3.9 mmol/L (ref 3.5–5.1)
Sodium: 138 mmol/L (ref 135–145)

## 2021-01-14 ENCOUNTER — Telehealth (HOSPITAL_COMMUNITY): Payer: Self-pay | Admitting: Emergency Medicine

## 2021-01-14 NOTE — Telephone Encounter (Signed)
Unable to leave vm for callback °Floyde Dingley RN Navigator Cardiac Imaging °[facility] °[phone] Office  °[phone] Cell  ° °

## 2021-01-15 ENCOUNTER — Other Ambulatory Visit: Payer: Self-pay

## 2021-01-15 ENCOUNTER — Encounter (HOSPITAL_COMMUNITY): Payer: Self-pay

## 2021-01-15 ENCOUNTER — Ambulatory Visit (HOSPITAL_COMMUNITY)
Admission: RE | Admit: 2021-01-15 | Discharge: 2021-01-15 | Disposition: A | Discharge status: Home / Self Care | Payer: Self-pay | Source: Ambulatory Visit | Attending: Cardiology | Admitting: Cardiology

## 2021-01-15 DIAGNOSIS — R079 Chest pain, unspecified: Secondary | ICD-10-CM | POA: Insufficient documentation

## 2021-01-15 MED ORDER — IOHEXOL 350 MG/ML SOLN
95.0000 mL | Freq: Once | INTRAVENOUS | Status: AC | PRN
  Administered 2021-01-15: 10:00:00 95 mL via INTRAVENOUS

## 2021-01-15 MED ORDER — NITROGLYCERIN 0.4 MG SL SUBL
SUBLINGUAL_TABLET | SUBLINGUAL | Status: AC
  Administered 2021-01-15: 10:00:00 0.8 mg via SUBLINGUAL
  Filled 2021-01-15: qty 2

## 2021-01-15 MED ORDER — NITROGLYCERIN 0.4 MG SL SUBL: 0.8000 mg | SUBLINGUAL_TABLET | Freq: Once | SUBLINGUAL | Status: AC

## 2021-02-12 ENCOUNTER — Ambulatory Visit (HOSPITAL_COMMUNITY)
Admission: RE | Admit: 2021-02-12 | Discharge: 2021-02-12 | Disposition: A | Discharge status: Home / Self Care | Payer: Self-pay | Source: Ambulatory Visit | Attending: Cardiology | Admitting: Cardiology

## 2021-02-12 DIAGNOSIS — R55 Syncope and collapse: Secondary | ICD-10-CM | POA: Insufficient documentation

## 2021-02-12 LAB — ECHOCARDIOGRAM COMPLETE
AR max vel: 2.74 cm2
AV Area VTI: 2.79 cm2
AV Area mean vel: 2.99 cm2
AV Mean grad: 3.9 mmHg
AV Peak grad: 8.3 mmHg
Ao pk vel: 1.44 m/s
Area-P 1/2: 4.31 cm2
S' Lateral: 4.1 cm

## 2021-02-12 NOTE — Progress Notes (Signed)
*  PRELIMINARY RESULTS* Echocardiogram 2D Echocardiogram has been performed.  Stacey Drain 02/12/2021, 1:30 PM

## 2021-02-28 ENCOUNTER — Other Ambulatory Visit: Payer: Self-pay | Admitting: Physician Assistant

## 2021-02-28 DIAGNOSIS — N478 Other disorders of prepuce: Secondary | ICD-10-CM

## 2021-03-05 ENCOUNTER — Other Ambulatory Visit: Payer: Self-pay

## 2021-03-05 ENCOUNTER — Ambulatory Visit (INDEPENDENT_AMBULATORY_CARE_PROVIDER_SITE_OTHER): Payer: Self-pay | Admitting: Urology

## 2021-03-05 ENCOUNTER — Encounter: Payer: Self-pay | Admitting: Urology

## 2021-03-05 VITALS — BP 131/79 | HR 53 | Wt 170.0 lb

## 2021-03-05 DIAGNOSIS — N489 Disorder of penis, unspecified: Secondary | ICD-10-CM

## 2021-03-05 LAB — URINALYSIS, ROUTINE W REFLEX MICROSCOPIC
Bilirubin, UA: NEGATIVE
Glucose, UA: NEGATIVE
Ketones, UA: NEGATIVE
Leukocytes,UA: NEGATIVE
Nitrite, UA: NEGATIVE
Protein,UA: NEGATIVE
RBC, UA: NEGATIVE
Specific Gravity, UA: >1.03 — ABNORMAL HIGH (ref 1.005–1.030)
Urobilinogen, Ur: 0.2 mg/dL (ref 0.2–1.0)
pH, UA: 5.5 (ref 5.0–7.5)

## 2021-03-05 NOTE — Progress Notes (Signed)
Assessment: 1. Penile lesion      Plan: I reviewed the patient's records from his recent urgent care visit.  Given his recent procedure on the lesion, diagnosis is limited.  His STD evaluation was negative.   I would recommend completion of his antibiotics and continued local care to the area.   We will reevaluate in 3-4 weeks once this area has had a chance to heal.  Chief Complaint:  Chief Complaint  Patient presents with   penile lesion    History of Present Illness:  Trevor Pacheco is a 36 y.o. year old male who is seen in consultation from [facility], [place] for evaluation of lesion on penis. He reported noting a lesion on his foreskin in November 2022.  He was concerned about a possible insect bite.  He noted some intermittent drainage from the area.  No significant pain.  The lesion was not increasing in size.  He was seen at Urgent Care and underwent incision and drainage of "cyst" on 02/27/21. Chlamydia, gonococcus, trichomonas, and syphilis testing all negative. He is currently on doxycycline.  He is cleaning the area and applying topical antibiotic ointment.  He is not having any drainage from the area.  He has some occasional discomfort associated with clothing touching the area.  He is not having any urinary symptoms at the present time.  Past Medical History:  Past Medical History:  Diagnosis Date   Heart murmur     Past Surgical History:  Past Surgical History:  Procedure Laterality Date   ABSCESS DRAINAGE     HAND SURGERY      Allergies:  Allergies  Allergen Reactions   Clindamycin/Lincomycin Other (See Comments)    ?  Pt says it caused an infection in his throat)   Penicillins Nausea And Vomiting    Family History:  Family History  Problem Relation Age of Onset   Cirrhosis Father    Alcoholism Father    Diabetes Maternal Aunt     Social History:  Social History   Tobacco Use   Smoking status: Every Day    Packs/day: 0.25    Types:  Cigarettes   Smokeless tobacco: Never  Vaping Use   Vaping Use: Never used  Substance Use Topics   Alcohol use: Not Currently    Comment: hx heavy etoh.  none since age 84   Drug use: Yes    Types: Marijuana    Review of symptoms:  Constitutional:  Negative for unexplained weight loss, night sweats, fever, chills ENT:  Negative for nose bleeds, sinus pain, painful swallowing CV:  Negative for chest pain, shortness of breath, exercise intolerance, palpitations, loss of consciousness Resp:  Negative for cough, wheezing, shortness of breath GI:  Negative for nausea, vomiting, diarrhea, bloody stools GU:  Positives noted in HPI; otherwise negative for gross hematuria, dysuria, urinary incontinence Neuro:  Negative for seizures, poor balance, limb weakness, slurred speech Psych:  Negative for lack of energy, depression, anxiety Endocrine:  Negative for polydipsia, polyuria, symptoms of hypoglycemia (dizziness, hunger, sweating) Hematologic:  Negative for anemia, purpura, petechia, prolonged or excessive bleeding, use of anticoagulants  Allergic:  Negative for difficulty breathing or choking as a result of exposure to anything; no shellfish allergy; no allergic response (rash/itch) to materials, foods  Physical exam: BP 131/79 (BP Location: Left Arm)    Pulse (!) 53    Wt 170 lb (77.1 kg)    BMI 22.43 kg/m  GENERAL APPEARANCE:  Well appearing, well developed,  well nourished, NAD HEENT:  Atraumatic, normocephalic, oropharynx clear NECK:  Supple without lymphadenopathy or thyromegaly ABDOMEN:  Soft, non-tender, no masses EXTREMITIES:  Moves all extremities well, without clubbing, cyanosis, or edema NEUROLOGIC:  Alert and oriented x 3, normal gait, CN II-XII grossly intact MENTAL STATUS:  appropriate BACK:  Non-tender to palpation, No CVAT SKIN:  Warm, dry, and intact GU: Penis:  uncircumcised; 1 cm nodule on right distal penile shaft with central ulceration; NT, no purulent  drainage Meatus: Normal Scrotum: normal, no masses Testis: normal without masses bilateral  Results: U/A dipstick negative

## 2021-04-02 ENCOUNTER — Other Ambulatory Visit: Payer: Self-pay | Admitting: Urology

## 2021-04-02 ENCOUNTER — Encounter: Payer: Self-pay | Admitting: Urology

## 2021-04-02 ENCOUNTER — Ambulatory Visit (INDEPENDENT_AMBULATORY_CARE_PROVIDER_SITE_OTHER): Payer: Self-pay | Admitting: Urology

## 2021-04-02 ENCOUNTER — Other Ambulatory Visit: Payer: Self-pay

## 2021-04-02 VITALS — BP 122/78 | HR 80

## 2021-04-02 DIAGNOSIS — N489 Disorder of penis, unspecified: Secondary | ICD-10-CM | POA: Insufficient documentation

## 2021-04-02 LAB — URINALYSIS, ROUTINE W REFLEX MICROSCOPIC
Bilirubin, UA: NEGATIVE
Glucose, UA: NEGATIVE
Leukocytes,UA: NEGATIVE
Nitrite, UA: NEGATIVE
Protein,UA: NEGATIVE
RBC, UA: NEGATIVE
Specific Gravity, UA: >1.03 — ABNORMAL HIGH (ref 1.005–1.030)
Urobilinogen, Ur: 1 mg/dL (ref 0.2–1.0)
pH, UA: 6 (ref 5.0–7.5)

## 2021-04-02 NOTE — H&P (View-Only) (Signed)
? ?Assessment: ?1. Penile lesion   ? ?  ?Plan: ?I discussed options for management of the persistent penile lesion with the patient today.  He would like to proceed with some treatment as he continues to have drainage from this area.  I discussed surgical excision of the lesion.  He would like to have this done with some anesthesia. ? ?Procedure: ?The patient will be scheduled for excision of penile lesion at [facility].  Surgical request is placed with the surgery schedulers and will be scheduled at the patient's/family request. Informed consent is given as documented below. ?Anesthesia: MAC with local ? ?The patient does not have sleep apnea, history of MRSA, history of VRE, history of cardiac device requiring special anesthetic needs. ?Patient is stable and considered clear for surgical in an outpatient ambulatory surgery setting as well as patient hospital setting. ? ?Consent for Operation or Procedure: Provider Certification ?I hereby certify that the nature, purpose, benefits, usual and most frequent risks of, and alternatives to, the operation or procedure have been explained to the patient (or person authorized to sign for the patient) either by me as responsible physician or by the provider who is to perform the operation or procedure. Time spent such that the patient/family has had an opportunity to ask questions, and that those questions have been answered. The patient or the patient's representative has been advised that selected tasks may be performed by assistants to the primary health care provider(s). I believe that the patient (or person authorized to sign for the patient) understands what has been explained, and has consented to the operation or procedure. No guarantees were implied or made. ? ? ?Chief Complaint:  ?Chief Complaint  ?Patient presents with  ? Penile lesion  ? ? ?History of Present Illness: ? ?Trevor Pacheco is a 36 y.o. year old male who is seen for further evaluation of lesion  on penis. ?He reported noting a lesion on his foreskin in November 2022.  He was concerned about a possible insect bite.  He noted some intermittent drainage from the area.  No significant pain.  The lesion did not increase  ? in size.  He was seen at Urgent Care and underwent incision and drainage of "cyst" on 02/27/21. ?Chlamydia, gonococcus, trichomonas, and syphilis testing all negative. ?He was treated with doxycycline.  He has been cleaning the area and applying topical antibiotic ointment.   ? ?He returns today for follow-up.  He continues to have intermittent drainage from the penile lesion.  This occurs every 2-3 days.  He continues to apply topical antibiotic ointment to the area.  He has not had any significant erythema or pain associated with the lesion. ? ?Portions of the above documentation were copied from a prior visit for review purposes only. ? ? ?Past Medical History:  ?Past Medical History:  ?Diagnosis Date  ? Heart murmur   ? ? ?Past Surgical History:  ?Past Surgical History:  ?Procedure Laterality Date  ? ABSCESS DRAINAGE    ? HAND SURGERY    ? ? ?Allergies:  ?Allergies  ?Allergen Reactions  ? Clindamycin/Lincomycin Other (See Comments)  ?  ?  Pt says it caused an infection in his throat)  ? Penicillins Nausea And Vomiting  ? ? ?Family History:  ?Family History  ?Problem Relation Age of Onset  ? Cirrhosis Father   ? Alcoholism Father   ? Diabetes Maternal Aunt   ? ? ?Social History:  ?Social History  ? ?Tobacco Use  ? Smoking  status: Every Day  ?  Packs/day: 0.25  ?  Types: Cigarettes  ? Smokeless tobacco: Never  ?Vaping Use  ? Vaping Use: Never used  ?Substance Use Topics  ? Alcohol use: Not Currently  ?  Comment: hx heavy etoh.  none since age 13  ? Drug use: Yes  ?  Types: Marijuana  ? ? ?ROS: ?Constitutional:  Negative for fever, chills, weight loss ?CV: Negative for chest pain, previous MI, hypertension ?Respiratory:  Negative for shortness of breath, wheezing, sleep apnea, frequent  cough ?GI:  Negative for nausea, vomiting, bloody stool, GERD ? ?Physical exam: ?BP 122/78   Pulse 80  ?GENERAL APPEARANCE:  Well appearing, well developed, well nourished, NAD ?HEENT: Atraumatic, Normocephalic, oropharynx clear. ?NECK: Supple without lymphadenopathy or thyromegaly. ?LUNGS: Clear to auscultation bilaterally. ?HEART: Regular Rate and Rhythm without murmurs, gallops, or rubs. ?ABDOMEN: Soft, non-tender, No Masses. ?EXTREMITIES: Moves all extremities well.  Without clubbing, cyanosis, or edema. ?NEUROLOGIC:  Alert and oriented x 3, normal gait, CN II-XII grossly intact.  ?MENTAL STATUS:  Appropriate. ?BACK:  Non-tender to palpation.  No CVAT ?SKIN:  Warm, dry and intact.   ?GU:  ?Penis:  uncircumcised; 1 cm nodular area on right distal foreskin; no erythema or drainage ? ?Results: ?U/A dipstick negative ? ?

## 2021-04-02 NOTE — Progress Notes (Signed)
? ?Assessment: ?1. Penile lesion   ? ?  ?Plan: ?I discussed options for management of the persistent penile lesion with the patient today.  He would like to proceed with some treatment as he continues to have drainage from this area.  I discussed surgical excision of the lesion.  He would like to have this done with some anesthesia. ? ?Procedure: ?The patient will be scheduled for excision of penile lesion at [facility].  Surgical request is placed with the surgery schedulers and will be scheduled at the patient's/family request. Informed consent is given as documented below. ?Anesthesia: MAC with local ? ?The patient does not have sleep apnea, history of MRSA, history of VRE, history of cardiac device requiring special anesthetic needs. ?Patient is stable and considered clear for surgical in an outpatient ambulatory surgery setting as well as patient hospital setting. ? ?Consent for Operation or Procedure: Provider Certification ?I hereby certify that the nature, purpose, benefits, usual and most frequent risks of, and alternatives to, the operation or procedure have been explained to the patient (or person authorized to sign for the patient) either by me as responsible physician or by the provider who is to perform the operation or procedure. Time spent such that the patient/family has had an opportunity to ask questions, and that those questions have been answered. The patient or the patient's representative has been advised that selected tasks may be performed by assistants to the primary health care provider(s). I believe that the patient (or person authorized to sign for the patient) understands what has been explained, and has consented to the operation or procedure. No guarantees were implied or made. ? ? ?Chief Complaint:  ?Chief Complaint  ?Patient presents with  ? Penile lesion  ? ? ?History of Present Illness: ? ?Trevor Pacheco is a 36 y.o. year old male who is seen for further evaluation of lesion  on penis. ?He reported noting a lesion on his foreskin in November 2022.  He was concerned about a possible insect bite.  He noted some intermittent drainage from the area.  No significant pain.  The lesion did not increase  ? in size.  He was seen at Urgent Care and underwent incision and drainage of "cyst" on 02/27/21. ?Chlamydia, gonococcus, trichomonas, and syphilis testing all negative. ?He was treated with doxycycline.  He has been cleaning the area and applying topical antibiotic ointment.   ? ?He returns today for follow-up.  He continues to have intermittent drainage from the penile lesion.  This occurs every 2-3 days.  He continues to apply topical antibiotic ointment to the area.  He has not had any significant erythema or pain associated with the lesion. ? ?Portions of the above documentation were copied from a prior visit for review purposes only. ? ? ?Past Medical History:  ?Past Medical History:  ?Diagnosis Date  ? Heart murmur   ? ? ?Past Surgical History:  ?Past Surgical History:  ?Procedure Laterality Date  ? ABSCESS DRAINAGE    ? HAND SURGERY    ? ? ?Allergies:  ?Allergies  ?Allergen Reactions  ? Clindamycin/Lincomycin Other (See Comments)  ?  ?  Pt says it caused an infection in his throat)  ? Penicillins Nausea And Vomiting  ? ? ?Family History:  ?Family History  ?Problem Relation Age of Onset  ? Cirrhosis Father   ? Alcoholism Father   ? Diabetes Maternal Aunt   ? ? ?Social History:  ?Social History  ? ?Tobacco Use  ? Smoking  status: Every Day  ?  Packs/day: 0.25  ?  Types: Cigarettes  ? Smokeless tobacco: Never  ?Vaping Use  ? Vaping Use: Never used  ?Substance Use Topics  ? Alcohol use: Not Currently  ?  Comment: hx heavy etoh.  none since age 72  ? Drug use: Yes  ?  Types: Marijuana  ? ? ?ROS: ?Constitutional:  Negative for fever, chills, weight loss ?CV: Negative for chest pain, previous MI, hypertension ?Respiratory:  Negative for shortness of breath, wheezing, sleep apnea, frequent  cough ?GI:  Negative for nausea, vomiting, bloody stool, GERD ? ?Physical exam: ?BP 122/78   Pulse 80  ?GENERAL APPEARANCE:  Well appearing, well developed, well nourished, NAD ?HEENT: Atraumatic, Normocephalic, oropharynx clear. ?NECK: Supple without lymphadenopathy or thyromegaly. ?LUNGS: Clear to auscultation bilaterally. ?HEART: Regular Rate and Rhythm without murmurs, gallops, or rubs. ?ABDOMEN: Soft, non-tender, No Masses. ?EXTREMITIES: Moves all extremities well.  Without clubbing, cyanosis, or edema. ?NEUROLOGIC:  Alert and oriented x 3, normal gait, CN II-XII grossly intact.  ?MENTAL STATUS:  Appropriate. ?BACK:  Non-tender to palpation.  No CVAT ?SKIN:  Warm, dry and intact.   ?GU:  ?Penis:  uncircumcised; 1 cm nodular area on right distal foreskin; no erythema or drainage ? ?Results: ?U/A dipstick negative ? ?

## 2021-04-02 NOTE — Progress Notes (Signed)
Surgical Physician Order [facility] [place] ? ?* Scheduling expectation : Next Available ? ?*Length of Case: 30 minutes ? ?*MD Preforming Case: Michaelle Birks, MD ? ?*Assistant Needed: no ? ?*Facility Preference: [facility] ? ?*Clearance needed: no ? ?*Anticoagulation Instructions: N/A ? ?*Aspirin Instructions: N/A ? ?-Admit type: OUTpatient ? ?-Anesthesia: MAC with local ? ?-Use Standing Orders:  none ? ?*Diagnosis:  Penile lesion ? ?*Procedure:  excision of penile lesion   ? ?Additional orders: N/A ? ?-Equipment:  none ?-VTE Prophylaxis Standing Order SCD?s    ?   ?Other:  ? ?-Standing Lab Orders Per Anesthesia   ? ?Lab other: None ? ?-Standing Test orders EKG/Chest x-ray per Anesthesia      ? ?Test other:  ? ?- Medications:  Ancef 2gm IV ? ?-Other orders:  N/A ? ?*Post-op visit Date/Instructions:  1 month follow up ? ?

## 2021-04-05 ENCOUNTER — Telehealth: Payer: Self-pay

## 2021-04-05 NOTE — Progress Notes (Signed)
[facility] [place] Surgery Posting Form  ? ?Surgery Date/Time: Date: 04/15/2021 ? ?Surgeon: Dr. Di Kindle, MD ? ?Surgery Location: Day Surgery ? ?Inpt ( No  )   Outpt (Yes)   Obs ( No  )  ? ?Diagnosis: N48.9 Penile Lesion ? ?-CPT: 54060 ? ?Surgery: Excision of Penile Lesion ? ?Stop Anticoagulations: N/A ? ?Cardiac/Medical/Pulmonary Clearance needed: no ? ?*Orders entered into EPIC  Date: 04/05/21  ? ?*Case booked in [place]  Date: 04/04/2021 ? ?*Notified pt of Surgery: Date: 04/04/2021 ? ?PRE-OP UA & CX: no ? ?*Placed into Prior Authorization Work Que Date: 04/05/21 ? ? ?Assistant/laser/rep:No ? ? ? ? ? ? ? ? ? ? ? ? ? ? ? ?

## 2021-04-05 NOTE — Telephone Encounter (Signed)
I spoke with Trevor Pacheco. We have discussed possible surgery dates and Monday March 20th, 2023 was agreed upon by all parties.  ? ?Patient given information about surgery date, what to expect pre-operatively and post operatively. We discussed that a pre-op nurse will be calling to set up the pre-op visit that will take place prior to surgery. ? ? Informed patient that our office will communicate any additional care to be provided after surgery. Patients questions or concerns were discussed during our call. Advised to call our office should there be any additional information, questions or concerns that arise. Patient verbalized understanding.  ? ?

## 2021-04-10 NOTE — Patient Instructions (Addendum)
? ? Trevor Pacheco ? 04/10/2021  ?  ? @PREFPERIOPPHARMACY @ ? ? Your procedure is scheduled on 04/15/2021. ? [facility]. ? Call this number if you have problems the morning of surgery: ? [phone] ? ? Remember: ? Do not eat or drink after midnight. ?   ? Take these medicines the morning of surgery with A SIP OF WATER : none ?  ? Do not wear jewelry, make-up or nail polish. ? Do not wear lotions, powders, or perfumes, or deodorant. ? Do not shave 48 hours prior to surgery.  Men may shave face and neck. ? Do not bring valuables to the hospital. ? [facility] is not responsible for any belongings or valuables. ? ?Contacts, dentures or bridgework may not be worn into surgery.  Leave your suitcase in the car.  After surgery it may be brought to your room. ? ?For patients admitted to the hospital, discharge time will be determined by your treatment team. ? ?Patients discharged the day of surgery will not be allowed to drive home.  ? ?Name and phone number of your driver:   Family ?Special instructions:  N/A ? ?Please read over the following fact sheets that you were given. ?Care and Recovery After Surgery ? ?Excision Penile Lesion ? ?Excision of Skin Lesions ?Excision of a skin lesion is the removal of a section of skin by making small incisions in the skin. Through this process, the lesion is completely removed. This procedure is often done to treat or prevent cancer or infection. It may also be done to improve cosmetic appearance. ?You may have this procedure to remove: ?Cancerous (malignant) growths, such as basal cell carcinoma, squamous cell carcinoma, or melanoma. ?Noncancerous (benign) growths, such as a cyst or lipoma. ?Growths, such as moles or skin tags, which may be removed for cosmetic reasons. ?Various excision or surgical techniques may be used depending on your condition, the location of the lesion, and your overall health. ?Tell your health care provider about: ?Any allergies you  have. ?All medicines you are taking, including vitamins, herbs, eye drops, creams, and over-the-counter medicines. ?Any problems you or family members have had with anesthetic medicines. ?Any bleeding problems you have. ?Any surgeries you have had. ?Any medical conditions you have. ?Whether you are pregnant or may be pregnant. ?What are the risks? ?Generally, this is a safe procedure. However, problems may occur, including: ?Bleeding. ?Infection. ?Scarring. ?Recurrence of the cyst, lipoma, or cancer. ?Allergic reaction to anesthetics, surgical materials, or ointments. ?Damage to nerves, blood vessels, muscles, or other structures. ?What happens before the procedure? ?Medicines ?Ask your health care provider about: ?Changing or stopping your regular medicines. This is especially important if you are taking diabetes medicines or blood thinners. ?Taking medicines such as aspirin and ibuprofen. These medicines can thin your blood. Do not take these medicines unless your health care provider tells you to take them. ?Taking over-the-counter medicines, vitamins, herbs, and supplements. ?General instructions ?Do not use any products that contain nicotine or tobacco. These products include cigarettes, chewing tobacco, and vaping devices, such as e-cigarettes. If you need help quitting, ask your health care provider. ?Follow instructions from your health care provider about eating or drinking restrictions. ?Ask your health care provider: ?How your surgery site will be marked. ?What steps will be taken to help prevent infection. These steps may include: ?Removing hair at the surgery site. ?Washing skin with a germ-killing soap. ?Taking antibiotic medicine. ?Ask your health care provider if you  will need someone to take you home from the hospital or clinic after the procedure. ?What happens during the procedure? ? ?You will be given a medicine to numb the area (local anesthetic). ?Your health care provider will remove the  lesions using one of the following excision techniques. ?Complete surgical excision. This procedure may be done to treat a cancerous growth or a noncancerous cyst or lesion. ?A small scalpel or scissors will be used to gently cut around and under the lesion until it is completely removed. ?If bleeding occurs, it will be stopped with a device that delivers heat (electrocautery). ?The edges of the wound may be stitched (sutured) together. ?A bandage (dressing) will be applied. ?Samples will be sent to a lab for testing. ?Excision of a cyst. ?An incision will be made on the cyst. ?The entire cyst will be removed through the incision. ?The incision may be closed with sutures. ?Shave excision. This may be done to remove a mole or other small growths. ?A small blade or scalpel will be used to shave off the lesion. ?The wound is usually left to heal on its own without sutures. ?The sample may be sent to a lab for testing. ?Punch excision. This may be done to completely remove a mole or other small growths. ?A small tool that is like a cookie cutter or a hole punch is used to cut a circle shape out of the skin. ?The outer edges of the skin will be sutured together. ?The sample may be sent to a lab for testing. ?Mohs micrographic surgery. This is usually done to treat skin cancer. This type of excision is mostly used on the face and ears. This procedure is minimally invasive, and it ensures the best cosmetic outcome. ?A scalpel or a loop instrument will be used to remove layers of the lesion until all the abnormal or cancerous tissue has been removed. ?The wound may be sutured, depending on its size. ?The tissue will be checked under a microscope right away. ?The procedure may vary among health care providers and hospitals. At the end of any of these procedures, antibiotic ointment will be applied as needed. ?What happens after the procedure? ?Talk with your health care provider to discuss any test results, treatment  options, and if necessary, the need for more tests. ?Keep all follow-up visits. This is important. ?Summary ?Excision of a skin lesion is the removal of a section of skin by making small incisions in the skin. This procedure is often done to treat or prevent skin cancer, remove benign growths, or it may be done to improve cosmetic appearance. ?Various excision or surgical techniques may be used depending on your condition, the location of the lesion, and your overall health. ?After the procedure, talk with your health care provider to discuss any test results, treatment options, and if necessary, the need for more tests. ?Keep all follow-up visits. This is important. ?This information is not intended to replace advice given to you by your health care provider. Make sure you discuss any questions you have with your health care provider. ?Document Revised: 08/14/2020 Document Reviewed: 08/14/2020 ?Elsevier Patient Education ? 2022 [employer] ? ?General Anesthesia, Adult ?General anesthesia is the use of medicines to make a person "go to sleep" (unconscious) for a medical procedure. General anesthesia must be used for certain procedures, and is often recommended for procedures that: ?Last a long time. ?Require you to be still or in an unusual position. ?Are major and can cause blood loss. ?The  medicines used for general anesthesia are called general anesthetics. As well as making you unconscious for a certain amount of time, these medicines: ?Prevent pain. ?Control your blood pressure. ?Relax your muscles. ?Tell a health care provider about: ?Any allergies you have. ?All medicines you are taking, including vitamins, herbs, eye drops, creams, and over-the-counter medicines. ?Any problems you or family members have had with anesthetic medicines. ?Types of anesthetics you have had in the past. ?Any blood disorders you have. ?Any surgeries you have had. ?Any medical conditions you have. ?Any recent upper respiratory,  chest, or ear infections. ?Any history of: ?Heart or lung conditions, such as heart failure, sleep apnea, asthma, or chronic obstructive pulmonary disease (COPD). ?Financial planner. ?Depression or anxiety. ?Any toba

## 2021-04-11 ENCOUNTER — Encounter (HOSPITAL_COMMUNITY): Payer: Self-pay

## 2021-04-11 ENCOUNTER — Encounter (HOSPITAL_COMMUNITY)
Admission: RE | Admit: 2021-04-11 | Discharge: 2021-04-11 | Disposition: A | Discharge status: Home / Self Care | Payer: Self-pay | Source: Ambulatory Visit | Attending: Urology | Admitting: Urology

## 2021-04-15 ENCOUNTER — Ambulatory Visit (HOSPITAL_COMMUNITY): Payer: Self-pay | Admitting: Anesthesiology

## 2021-04-15 ENCOUNTER — Ambulatory Visit (HOSPITAL_BASED_OUTPATIENT_CLINIC_OR_DEPARTMENT_OTHER): Payer: Self-pay | Admitting: Anesthesiology

## 2021-04-15 ENCOUNTER — Other Ambulatory Visit: Payer: Self-pay

## 2021-04-15 ENCOUNTER — Encounter (HOSPITAL_COMMUNITY)
Admission: RE | Disposition: A | Discharge status: Home / Self Care | Payer: Self-pay | Source: Home / Self Care | Attending: Urology

## 2021-04-15 ENCOUNTER — Ambulatory Visit (HOSPITAL_COMMUNITY)
Admission: RE | Admit: 2021-04-15 | Discharge: 2021-04-15 | Disposition: A | Discharge status: Home / Self Care | Payer: Self-pay | Attending: Urology | Admitting: Urology

## 2021-04-15 DIAGNOSIS — N4889 Other specified disorders of penis: Secondary | ICD-10-CM

## 2021-04-15 DIAGNOSIS — N489 Disorder of penis, unspecified: Secondary | ICD-10-CM

## 2021-04-15 DIAGNOSIS — L720 Epidermal cyst: Secondary | ICD-10-CM | POA: Insufficient documentation

## 2021-04-15 DIAGNOSIS — F17210 Nicotine dependence, cigarettes, uncomplicated: Secondary | ICD-10-CM | POA: Insufficient documentation

## 2021-04-15 HISTORY — PX: LESION DESTRUCTION: SHX5132

## 2021-04-15 SURGERY — DESTRUCTION, LESION, PENIS
Anesthesia: General | Site: Penis

## 2021-04-15 MED ORDER — DEXAMETHASONE SODIUM PHOSPHATE 10 MG/ML IJ SOLN
INTRAMUSCULAR | Status: DC | PRN
Start: 2021-04-15 — End: 2021-04-15
  Administered 2021-04-15: 10 mg via INTRAVENOUS

## 2021-04-15 MED ORDER — BUPIVACAINE HCL 0.25 % IJ SOLN
INTRAMUSCULAR | Status: DC | PRN
  Administered 2021-04-15: 6 mL

## 2021-04-15 MED ORDER — DEXMEDETOMIDINE (PRECEDEX) IN NS 20 MCG/5ML (4 MCG/ML) IV SYRINGE
PREFILLED_SYRINGE | INTRAVENOUS | Status: DC | PRN
  Administered 2021-04-15: 20 ug via INTRAVENOUS

## 2021-04-15 MED ORDER — LACTATED RINGERS IV SOLN: INTRAVENOUS | Status: DC

## 2021-04-15 MED ORDER — MIDAZOLAM HCL 2 MG/2ML IJ SOLN
INTRAMUSCULAR | Status: AC
  Filled 2021-04-15: qty 2

## 2021-04-15 MED ORDER — CEFAZOLIN SODIUM-DEXTROSE 2-4 GM/100ML-% IV SOLN
INTRAVENOUS | Status: AC
  Filled 2021-04-15: qty 100

## 2021-04-15 MED ORDER — ONDANSETRON HCL 4 MG/2ML IJ SOLN: 4.0000 mg | Freq: Once | INTRAMUSCULAR | Status: DC | PRN

## 2021-04-15 MED ORDER — CEFAZOLIN SODIUM-DEXTROSE 2-4 GM/100ML-% IV SOLN
2.0000 g | INTRAVENOUS | Status: AC
  Administered 2021-04-15: 2 g via INTRAVENOUS

## 2021-04-15 MED ORDER — MIDAZOLAM HCL 5 MG/5ML IJ SOLN
INTRAMUSCULAR | Status: DC | PRN
  Administered 2021-04-15: 2 mg via INTRAVENOUS

## 2021-04-15 MED ORDER — SODIUM CHLORIDE 0.9 % IR SOLN
Status: DC | PRN
Start: 2021-04-15 — End: 2021-04-15
  Administered 2021-04-15: 1000 mL

## 2021-04-15 MED ORDER — ONDANSETRON HCL 4 MG/2ML IJ SOLN
INTRAMUSCULAR | Status: DC | PRN
  Administered 2021-04-15: 4 mg via INTRAVENOUS

## 2021-04-15 MED ORDER — HYDROMORPHONE HCL 1 MG/ML IJ SOLN: 0.2500 mg | INTRAMUSCULAR | Status: DC | PRN

## 2021-04-15 MED ORDER — FENTANYL CITRATE (PF) 100 MCG/2ML IJ SOLN
INTRAMUSCULAR | Status: AC
  Filled 2021-04-15: qty 2

## 2021-04-15 MED ORDER — ORAL CARE MOUTH RINSE: 15.0000 mL | Freq: Once | OROMUCOSAL | Status: AC

## 2021-04-15 MED ORDER — LIDOCAINE HCL (CARDIAC) PF 100 MG/5ML IV SOSY
PREFILLED_SYRINGE | INTRAVENOUS | Status: DC | PRN
  Administered 2021-04-15: 50 mg via INTRAVENOUS

## 2021-04-15 MED ORDER — CHLORHEXIDINE GLUCONATE 0.12 % MT SOLN
15.0000 mL | Freq: Once | OROMUCOSAL | Status: AC
  Administered 2021-04-15: 15 mL via OROMUCOSAL

## 2021-04-15 MED ORDER — BUPIVACAINE HCL (PF) 0.25 % IJ SOLN
INTRAMUSCULAR | Status: AC
  Filled 2021-04-15: qty 30

## 2021-04-15 MED ORDER — ONDANSETRON HCL 4 MG/2ML IJ SOLN
INTRAMUSCULAR | Status: AC
  Filled 2021-04-15: qty 2

## 2021-04-15 MED ORDER — PROPOFOL 10 MG/ML IV BOLUS
INTRAVENOUS | Status: DC | PRN
  Administered 2021-04-15: 200 mg via INTRAVENOUS

## 2021-04-15 MED ORDER — FENTANYL CITRATE (PF) 100 MCG/2ML IJ SOLN
INTRAMUSCULAR | Status: DC | PRN
  Administered 2021-04-15: 100 ug via INTRAVENOUS

## 2021-04-15 MED ORDER — PROPOFOL 10 MG/ML IV BOLUS
INTRAVENOUS | Status: AC
  Filled 2021-04-15: qty 20

## 2021-04-15 MED ORDER — BACITRACIN ZINC 500 UNIT/GM EX OINT
TOPICAL_OINTMENT | CUTANEOUS | Status: AC
  Filled 2021-04-15: qty 0.9

## 2021-04-15 MED ORDER — MEPERIDINE HCL 50 MG/ML IJ SOLN: 6.2500 mg | INTRAMUSCULAR | Status: DC | PRN

## 2021-04-15 MED ORDER — DEXAMETHASONE SODIUM PHOSPHATE 10 MG/ML IJ SOLN
INTRAMUSCULAR | Status: AC
  Filled 2021-04-15: qty 1

## 2021-04-15 MED ORDER — HYDROCODONE-ACETAMINOPHEN 5-325 MG PO TABS: 1.0000 | ORAL_TABLET | Freq: Four times a day (QID) | ORAL | 0 refills | Status: DC | PRN

## 2021-04-15 SURGICAL SUPPLY — 20 items
BANDAGE CONFORM 2  STR LF (GAUZE/BANDAGES/DRESSINGS) ×1 IMPLANT
BNDG COHESIVE 4X5 TAN ST LF (GAUZE/BANDAGES/DRESSINGS) ×1 IMPLANT
CLOTH BEACON ORANGE TIMEOUT ST (SAFETY) ×2 IMPLANT
COVER LIGHT HANDLE STERIS (MISCELLANEOUS) ×4 IMPLANT
ELECT NDL TIP 2.8 STRL (NEEDLE) IMPLANT
ELECT NEEDLE TIP 2.8 STRL (NEEDLE) ×2 IMPLANT
GAUZE SPONGE 4X4 12PLY STRL (GAUZE/BANDAGES/DRESSINGS) ×2 IMPLANT
GAUZE XEROFORM 1X8 LF (GAUZE/BANDAGES/DRESSINGS) ×1 IMPLANT
GLOVE BIO SURGEON STRL SZ7 (GLOVE) ×2 IMPLANT
GLOVE SURG EUDERMIC 8 LTX PF (GLOVE) ×2 IMPLANT
GOWN STRL REUS W/TWL LRG LVL3 (GOWN DISPOSABLE) ×4 IMPLANT
GOWN STRL REUS W/TWL XL LVL3 (GOWN DISPOSABLE) ×2 IMPLANT
KIT TURNOVER CYSTO (KITS) ×2 IMPLANT
MANIFOLD NEPTUNE II (INSTRUMENTS) ×2 IMPLANT
NS IRRIG 1000ML POUR BTL (IV SOLUTION) ×2 IMPLANT
PACK MINOR (CUSTOM PROCEDURE TRAY) ×2 IMPLANT
PAD ARMBOARD 7.5X6 YLW CONV (MISCELLANEOUS) ×2 IMPLANT
SET BASIN LINEN APH (SET/KITS/TRAYS/PACK) ×2 IMPLANT
SOL PREP PROV IODINE SCRUB 4OZ (MISCELLANEOUS) ×2 IMPLANT
SYR CONTROL 10ML LL (SYRINGE) ×1 IMPLANT

## 2021-04-15 NOTE — Anesthesia Postprocedure Evaluation (Signed)
Anesthesia Post Note ? ?Patient: Trevor Pacheco ? ?Procedure(s) Performed: LESION DESTRUCTION PENIS (Penis) ? ?Patient location during evaluation: Phase II ?Anesthesia Type: General ?Level of consciousness: awake and alert and oriented ?Pain management: pain level controlled ?Vital Signs Assessment: post-procedure vital signs reviewed and stable ?Respiratory status: spontaneous breathing, nonlabored ventilation and respiratory function stable ?Cardiovascular status: blood pressure returned to baseline and stable ?Postop Assessment: no apparent nausea or vomiting ?Anesthetic complications: no ? ? ?No notable events documented. ? ? ?Last Vitals:  ?Vitals:  ? 04/15/21 1145 04/15/21 1201  ?BP:  103/73  ?Pulse: 67 67  ?Resp: 17 18  ?Temp:  36.7 ?C  ?SpO2: 99% 100%  ?  ?Last Pain:  ?Vitals:  ? 04/15/21 1201  ?PainSc: 0-No pain  ? ? ?  ?  ?  ?  ?  ?  ? ?Diontae Route C [place] ? ? ? ? ?

## 2021-04-15 NOTE — Anesthesia Preprocedure Evaluation (Signed)
Anesthesia Evaluation  ?Patient identified by MRN, date of birth, ID band ?Patient awake ? ? ? ?Reviewed: ?Allergy & Precautions, NPO status , Patient's Chart, lab work & pertinent test results ? ?Airway ?Mallampati: II ? ?TM Distance: >3 FB ?Neck ROM: Full ? ? ? Dental ? ?(+) Dental Advisory Given, Missing, Poor Dentition, Chipped,  ?  ?Pulmonary ?Current Smoker,  ?  ?Pulmonary exam normal ?breath sounds clear to auscultation ? ? ? ? ? ? Cardiovascular ?Exercise Tolerance: Good ?+ dysrhythmias (PACs) + Valvular Problems/Murmurs  ?Rhythm:Irregular Rate:Normal ? ? ?  ?Neuro/Psych ?negative neurological ROS ? negative psych ROS  ? GI/Hepatic ?negative GI ROS, (+)  ?  ? substance abuse ? marijuana use,   ?Endo/Other  ?negative endocrine ROS ? Renal/GU ?negative Renal ROS  ?negative genitourinary ?  ?Musculoskeletal ?negative musculoskeletal ROS ?(+)  ? Abdominal ?  ?Peds ?negative pediatric ROS ?(+)  Hematology ?negative hematology ROS ?(+)   ?Anesthesia Other Findings ? ? Reproductive/Obstetrics ?negative OB ROS ? ?  ? ? ? ? ? ? ? ? ? ? ? ? ? ?  ?  ? ? ? ? ? ? ? ? ?Anesthesia Physical ?Anesthesia Plan ? ?ASA: 2 ? ?Anesthesia Plan: General  ? ?Post-op Pain Management: Dilaudid IV  ? ?Induction: Intravenous ? ?PONV Risk Score and Plan: 2 and Ondansetron ? ?Airway Management Planned: LMA ? ?Additional Equipment:  ? ?Intra-op Plan:  ? ?Post-operative Plan: Extubation in OR ? ?Informed Consent: I have reviewed the patients History and Physical, chart, labs and discussed the procedure including the risks, benefits and alternatives for the proposed anesthesia with the patient or authorized representative who has indicated his/her understanding and acceptance.  ? ? ? ?Dental advisory given ? ?Plan Discussed with: CRNA and Surgeon ? ?Anesthesia Plan Comments:   ? ? ? ? ? ? ?Anesthesia Quick Evaluation ? ?

## 2021-04-15 NOTE — Transfer of Care (Signed)
Immediate Anesthesia Transfer of Care Note ? ?Patient: Trevor Pacheco ? ?Procedure(s) Performed: LESION DESTRUCTION PENIS (Penis) ? ?Patient Location: PACU ? ?Anesthesia Type:General ? ?Level of Consciousness: drowsy and patient cooperative ? ?Airway & Oxygen Therapy: Patient Spontanous Breathing and Patient connected to nasal cannula oxygen ? ?Post-op Assessment: Report given to RN, Post -op Vital signs reviewed and stable and Patient moving all extremities X 4 ? ?Post vital signs: Reviewed and stable ? ?Last Vitals:  ?Vitals Value Taken Time  ?BP    ?Temp    ?Pulse    ?Resp    ?SpO2    ? ? ?Last Pain:  ?Vitals:  ? 04/15/21 0917  ?PainSc: 0-No pain  ?   ? ?  ? ?Complications: No notable events documented. ?

## 2021-04-15 NOTE — Op Note (Signed)
OPERATIVE NOTE ? ? ?Patient Name: [name] ? ?MRN:  [id number]  ? ?Date of Procedure: 04/15/21 ? ?Preoperative diagnosis:  ?Penile lesion ? ?Postoperative diagnosis:  ?Penile lesion ? ?Procedure:  ?Excision of penile lesion ? ?Attending: Milderd Meager, MD ? ?Anesthesia: General with local ? ?Estimated blood loss: 0 ml ? ?Fluids: Per anesthesia record ? ?Drains: None ? ?Specimens: Penile lesion ? ?Antibiotics: Ancef 2 gm IV ? ?Findings: 1 cm lesion on right mid penile shaft ? ?Indications:  ?36 year old male presents with persistent penile lesion for surgical excision.  He noted a lesion on the right side of his penile shaft in November 2022.  He underwent incision and drainage of the lesion in early February 2023.  STD testing was negative.  He was treated with oral antibiotics as well as topical antibiotic ointment.  He has continued to have intermittent drainage from the penile lesion.  No increase in size.  He has requested surgical excision.  He presents for excision of penile lesion today.  Risk and benefits discussed with the patient.  He understands and wishes to proceed as described. ? ?Description of Procedure:  ?The patient received IV Ancef preoperatively.  After successful induction of a general anesthetic, the patient was placed on the operating table in the supine position.  The patient's genital area was prepped and draped in sterile fashion.  A 1 cm skin colored lesion with some nodularity was noted on the mid right penile shaft.  No discharge noted.  The lesion was excised sharply.  Hemostasis was obtained with the cautery.  The skin edges were then approximated using interrupted 4-0 chromic suture.  A sterile dressing was applied.  A penile block was performed using quarter percent plain Marcaine.  Specimen was sent to pathology. ? ?Complications: None ? ?Condition: Stable, extubated, transferred to PACU ? ?Plan:  ?DC home today. ?Remove dressing in 2 days. ?

## 2021-04-15 NOTE — Anesthesia Procedure Notes (Signed)
Procedure Name: LMA Insertion ?Date/Time: 04/15/2021 10:46 AM ?Performed by: Shanon Payor, CRNA ?Pre-anesthesia Checklist: Patient identified, Emergency Drugs available, Suction available, Patient being monitored and Timeout performed ?Patient Re-evaluated:Patient Re-evaluated prior to induction ?Oxygen Delivery Method: Circle system utilized ?Preoxygenation: Pre-oxygenation with 100% oxygen ?Induction Type: IV induction ?LMA: LMA inserted ?LMA Size: 4.0 ?Number of attempts: 1 ?Placement Confirmation: positive ETCO2 and breath sounds checked- equal and bilateral ?Tube secured with: Tape ?Dental Injury: Teeth and Oropharynx as per pre-operative assessment  ? ? ? ? ?

## 2021-04-15 NOTE — Interval H&P Note (Signed)
History and Physical Interval Note: ? ?04/15/2021 ?10:16 AM ? ?Trevor Pacheco  has presented today for surgery, with the diagnosis of Penile Lesion.  The various methods of treatment have been discussed with the patient and family. After consideration of risks, benefits and other options for treatment, the patient has consented to  Procedure(s): ?LESION DESTRUCTION PENIS (N/A) as a surgical intervention.  The patient's history has been reviewed, patient examined, no change in status, stable for surgery.  I have reviewed the patient's chart and labs.  Questions were answered to the patient's satisfaction.   ? ? ?[name] ? ? ?

## 2021-04-16 LAB — SURGICAL PATHOLOGY

## 2021-04-17 ENCOUNTER — Encounter (HOSPITAL_COMMUNITY): Payer: Self-pay | Admitting: Urology

## 2021-04-17 NOTE — Progress Notes (Signed)
?Cardiology Office Note:   ? ?Date:  04/23/2021  ? ?ID:  VOYD GROFT, DOB 08-26-1985, MRN [id number] ? ?PCP:  Jacquelin Hawking, PA-C  ?Cardiologist:  Little Ishikawa, MD  ?Electrophysiologist:  None  ? ?Referring MD: Jacquelin Hawking, PA-C  ? ?Chief Complaint  ?Patient presents with  ? Chest Pain  ? ? ?History of Present Illness:   ? ?TEEJAY MEADER is a 36 y.o. male with a hx of tobacco use who presents for follow-up.  He was referred by Jacquelin Hawking, PA for evaluation of chest pain, initially seen on 01/03/2021.  He reports he is having exertional chest pain.  Describes pain as tightness, occurs with minimal exertion.  Reports if he overexerts himself the tightness will be intense and radiate to neck and will feel lightheaded, has even had syncopal episodes from this.  Last episode of syncope was 6 months ago.  He does report rare palpitations, none recently.  He started smoking at age 56, was up to 0.75 packs/day, currently about 0.25 packs/day.  No known family history of heart disease. ? ?Coronary CTA on 01/15/2021 showed normal coronary arteries, calcium score 0.  Echocardiogram on 02/12/2021 showed normal biventricular function, no significant valvular disease. ? ?Since last clinic visit, he reports that he is doing okay.  Chest pain has improved.  Has been having spells where arm goes were weak and cannot speak.  Happens multiple times per day.  Reports palpitations during episodes. ? ?Past Medical History:  ?Diagnosis Date  ? Heart murmur   ? ? ?Past Surgical History:  ?Procedure Laterality Date  ? ABSCESS DRAINAGE    ? HAND SURGERY    ? LESION DESTRUCTION N/A 04/15/2021  ? Procedure: LESION DESTRUCTION PENIS;  Surgeon: Milderd Meager., MD;  Location: [facility];  Service: Urology;  Laterality: N/A;  ? ? ?Current Medications: ?Current Meds  ?Medication Sig  ? acetaminophen (TYLENOL) 500 MG tablet Take 1,000 mg by mouth every 6 (six) hours as needed for mild pain or moderate pain.  ? aspirin 325  MG tablet Take 325 mg by mouth every other day.  ? HYDROcodone-acetaminophen (NORCO/VICODIN) 5-325 MG tablet Take 1 tablet by mouth every 6 (six) hours as needed for moderate pain.  ? ibuprofen (ADVIL,MOTRIN) 600 MG tablet Take 1 tablet (600 mg total) by mouth 4 (four) times daily.  ?  ? ?Allergies:   Clindamycin/lincomycin and Penicillins  ? ?Social History  ? ?Socioeconomic History  ? Marital status: Single  ?  Spouse name: Not on file  ? Number of children: Not on file  ? Years of education: Not on file  ? Highest education level: Not on file  ?Occupational History  ? Not on file  ?Tobacco Use  ? Smoking status: Every Day  ?  Packs/day: 0.25  ?  Types: Cigarettes  ? Smokeless tobacco: Never  ?Vaping Use  ? Vaping Use: Never used  ?Substance and Sexual Activity  ? Alcohol use: Not Currently  ?  Comment: hx heavy etoh.  none since age 4  ? Drug use: Yes  ?  Types: Marijuana  ? Sexual activity: Not on file  ?Other Topics Concern  ? Not on file  ?Social History Narrative  ? Not on file  ? ?Social Determinants of Health  ? ?Financial Resource Strain: Not on file  ?Food Insecurity: Not on file  ?Transportation Needs: Not on file  ?Physical Activity: Not on file  ?Stress: Not on file  ?Social Connections: Not on file  ?  ? ?  Family History: ?The patient's family history includes Alcoholism in his father; Cirrhosis in his father; Diabetes in his maternal aunt. ? ?ROS:   ?Please see the history of present illness.    ? All other systems reviewed and are negative. ? ?EKGs/Labs/Other Studies Reviewed:   ? ?The following studies were reviewed today: ? ? ?EKG:  EKG is  ordered today.  The ekg ordered today demonstrates sinus rhythm with sinus arrhythmia, rate 66, no ST abnormalities ? ?Recent Labs: ?10/15/2020: ALT 49 ?01/08/2021: BUN 12; Creatinine, Ser 0.74; Potassium 3.9; Sodium 138  ?Recent Lipid Panel ?   ?Component Value Date/Time  ? CHOL 176 10/15/2020 1014  ? TRIG 127 10/15/2020 1014  ? HDL 30 (L) 10/15/2020 1014  ?  CHOLHDL 5.9 10/15/2020 1014  ? VLDL 25 10/15/2020 1014  ? LDLCALC 121 (H) 10/15/2020 1014  ? ? ?Physical Exam:   ? ?VS:  BP 128/87   Pulse 75   Ht 6\' 1"  (1.854 m)   Wt 169 lb 6.4 oz (76.8 kg)   SpO2 98%   BMI 22.35 kg/m?    ? ?Wt Readings from Last 3 Encounters:  ?04/19/21 169 lb 6.4 oz (76.8 kg)  ?04/11/21 172 lb (78 kg)  ?03/05/21 170 lb (77.1 kg)  ?  ? ?GEN:  Well nourished, well developed in no acute distress ?HEENT: Normal ?NECK: No JVD; No carotid bruits ?LYMPHATICS: No lymphadenopathy ?CARDIAC: RRR, no murmurs, rubs, gallops ?RESPIRATORY:  Clear to auscultation without rales, wheezing or rhonchi  ?ABDOMEN: Soft, non-tender, non-distended ?MUSCULOSKELETAL:  No edema; No deformity  ?SKIN: Warm and dry ?NEUROLOGIC:  Alert and oriented x 3 ?PSYCHIATRIC:  Normal affect  ? ?ASSESSMENT:   ? ?1. Syncope, unspecified syncope type   ?2. Left arm numbness   ?3. Chest pain of uncertain etiology   ?4. Palpitations   ? ? ?PLAN:   ? ?Chest pain: Described exertional chest pain.  Coronary CTA on 01/15/2021 showed normal coronary arteries, calcium score 0.  Echocardiogram on 02/12/2021 showed normal biventricular function, no significant valvular disease.  No further cardiac work-up recommended. ? ?Syncope: Reports has had syncopal episodes with exertion.  Coronary CTA and echocardiogram unremarkable as above. ?-He is reporting sudden loss of consciousness and left arm numbness, will refer to neurology for evaluation ? ?Palpitations: Check Zio patch x7 days to rule out arrhythmia ? ?RTC in 1 year ? ? ?Medication Adjustments/Labs and Tests Ordered: ?Current medicines are reviewed at length with the patient today.  Concerns regarding medicines are outlined above.  ?Orders Placed This Encounter  ?Procedures  ? Ambulatory referral to Neurology  ? ?No orders of the defined types were placed in this encounter. ? ? ?Patient Instructions  ?Medication Instructions:  ?Your physician recommends that you continue on your current  medications as directed. Please refer to the Current Medication list given to you today. ? ?Labwork: ?none ? ?Testing/Procedures: ?ZIO- Long Term Monitor Instructions  ? ?Your physician has requested you wear your ZIO patch monitor 7 days.  ? ?This is a single patch monitor.  [employer] supplies one patch monitor per enrollment.  Additional stickers are not available. ?  ?Please do not apply patch if you will be having a Nuclear Stress Test, Echocardiogram, Cardiac CT, MRI, or Chest Xray during the time frame you would be wearing the monitor. The patch cannot be worn during these tests.  You cannot remove and re-apply the ZIO XT patch monitor. ?  ?Your ZIO patch monitor will be sent USPS Priority mail from  [employer] directly to your home address. The monitor may also be mailed to a PO BOX if home delivery is not available.   It may take 3-5 days to receive your monitor after you have been enrolled. ?  ?Once you have received you monitor, please review enclosed instructions.  Your monitor has already been registered assigning a specific monitor serial # to you. ? ? ?Applying the monitor  ? ?Shave hair from upper left chest. ?  ?Hold abrader disc by orange tab.  Rub abrader in 40 strokes over left upper chest as indicated in your monitor instructions. ?  ?Clean area with 4 enclosed alcohol pads .  Use all pads to assure are is cleaned thoroughly.  Let dry.  ? ?Apply patch as indicated in monitor instructions.  Patch will be place under collarbone on left side of chest with arrow pointing upward. ?  ?Rub patch adhesive wings for 2 minutes.Remove white label marked "1".  Remove white label marked "2".  Rub patch adhesive wings for 2 additional minutes. ?  ?While looking in a mirror, press and release button in center of patch.  A small green light will flash 3-4 times .  This will be your only indicator the monitor has been turned on. ?    ?Do not shower for the first 24 hours.  You may shower after the first 24  hours. ?  ?Press button if you feel a symptom. You will hear a small click.  Record Date, Time and Symptom in the Patient Log Book. ?  ?When you are ready to remove patch, follow instructions on last 2 pages o

## 2021-04-19 ENCOUNTER — Ambulatory Visit: Payer: Self-pay

## 2021-04-19 ENCOUNTER — Emergency Department (HOSPITAL_COMMUNITY)
Admission: EM | Admit: 2021-04-19 | Discharge: 2021-04-19 | Discharge status: Against Medical Advice | Payer: Self-pay | Attending: Emergency Medicine | Admitting: Emergency Medicine

## 2021-04-19 ENCOUNTER — Ambulatory Visit (INDEPENDENT_AMBULATORY_CARE_PROVIDER_SITE_OTHER): Payer: Self-pay | Admitting: Cardiology

## 2021-04-19 ENCOUNTER — Ambulatory Visit (INDEPENDENT_AMBULATORY_CARE_PROVIDER_SITE_OTHER): Payer: Self-pay

## 2021-04-19 ENCOUNTER — Encounter: Payer: Self-pay | Admitting: Cardiology

## 2021-04-19 ENCOUNTER — Telehealth: Payer: Self-pay

## 2021-04-19 ENCOUNTER — Other Ambulatory Visit: Payer: Self-pay | Admitting: Cardiology

## 2021-04-19 ENCOUNTER — Other Ambulatory Visit: Payer: Self-pay | Admitting: Urology

## 2021-04-19 VITALS — BP 128/87 | HR 75 | Ht 73.0 in | Wt 169.4 lb

## 2021-04-19 DIAGNOSIS — R079 Chest pain, unspecified: Secondary | ICD-10-CM

## 2021-04-19 DIAGNOSIS — R002 Palpitations: Secondary | ICD-10-CM

## 2021-04-19 DIAGNOSIS — R55 Syncope and collapse: Secondary | ICD-10-CM

## 2021-04-19 DIAGNOSIS — Z4889 Encounter for other specified surgical aftercare: Secondary | ICD-10-CM

## 2021-04-19 DIAGNOSIS — R200 Anesthesia of skin: Secondary | ICD-10-CM

## 2021-04-19 MED ORDER — HYDROCODONE-ACETAMINOPHEN 5-325 MG PO TABS: 1.0000 | ORAL_TABLET | Freq: Four times a day (QID) | ORAL | 0 refills | Status: DC | PRN

## 2021-04-19 NOTE — ED Provider Notes (Signed)
Presents with a urology complaints, notified by nursing staff that patient had left before being seen. If  the patient decides to be reassessed  will happy to see him.   ?  ?Carroll Sage, PA-C ?04/19/21 3734 ? ?  ?Vanetta Mulders, MD ?04/22/21 0719 ? ?

## 2021-04-19 NOTE — Telephone Encounter (Signed)
Patient called needing another 3 to 4 days of pain medication. ? ?His stitches came open from operation. ? ?Please call into: ? ?[facility] [place], [place] - [address] Phone:  [phone]  ?Fax:  [phone]  ?  ?Thanks, ?Rosey Bath ?

## 2021-04-19 NOTE — Telephone Encounter (Signed)
Please see patient requests for pain medication  ?

## 2021-04-19 NOTE — Telephone Encounter (Signed)
Received triage note that patient sutures from 04/15/2021 had come open. ? ?Discussed with Dr. Pete Glatter- okay for patient to place neosporin on post op site along with dressing to keep covered and cleaned. Called and spoke with patient. Instructions given to patient and voiced understanding.  ?

## 2021-04-19 NOTE — Patient Instructions (Signed)
Medication Instructions:  ?Your physician recommends that you continue on your current medications as directed. Please refer to the Current Medication list given to you today. ? ?Labwork: ?none ? ?Testing/Procedures: ?ZIO- Long Term Monitor Instructions  ? ?Your physician has requested you wear your ZIO patch monitor 7 days.  ? ?This is a single patch monitor.  [employer] supplies one patch monitor per enrollment.  Additional stickers are not available. ?  ?Please do not apply patch if you will be having a Nuclear Stress Test, Echocardiogram, Cardiac CT, MRI, or Chest Xray during the time frame you would be wearing the monitor. The patch cannot be worn during these tests.  You cannot remove and re-apply the ZIO XT patch monitor. ?  ?Your ZIO patch monitor will be sent USPS Priority mail from [facility] directly to your home address. The monitor may also be mailed to a PO BOX if home delivery is not available.   It may take 3-5 days to receive your monitor after you have been enrolled. ?  ?Once you have received you monitor, please review enclosed instructions.  Your monitor has already been registered assigning a specific monitor serial # to you. ? ? ?Applying the monitor  ? ?Shave hair from upper left chest. ?  ?Hold abrader disc by orange tab.  Rub abrader in 40 strokes over left upper chest as indicated in your monitor instructions. ?  ?Clean area with 4 enclosed alcohol pads .  Use all pads to assure are is cleaned thoroughly.  Let dry.  ? ?Apply patch as indicated in monitor instructions.  Patch will be place under collarbone on left side of chest with arrow pointing upward. ?  ?Rub patch adhesive wings for 2 minutes.Remove white label marked "1".  Remove white label marked "2".  Rub patch adhesive wings for 2 additional minutes. ?  ?While looking in a mirror, press and release button in center of patch.  A small green light will flash 3-4 times .  This will be your only indicator the monitor has been  turned on. ?    ?Do not shower for the first 24 hours.  You may shower after the first 24 hours. ?  ?Press button if you feel a symptom. You will hear a small click.  Record Date, Time and Symptom in the Patient Log Book. ?  ?When you are ready to remove patch, follow instructions on last 2 pages of Patient Log Book.  Stick patch monitor onto last page of Patient Log Book. ?  ?Place Patient Log Book in [facility] box.  Use locking tab on box and tape box closed securely.  The [employer] has [employer] on it.  Please place in mailbox as soon as possible.  Your physician should have your test results approximately 7 days after the monitor has been mailed back to [facility]. ?  ?Call [facility] at [phone] if you have questions regarding your ZIO XT patch monitor.  Call them immediately if you see an orange light blinking on your monitor. ?  ?If your monitor falls off in less than 4 days contact our Monitor department at [phone].  If your monitor becomes loose or falls off after 4 days call [employer] at [phone] for suggestions on securing your monitor. ? ?Follow-Up: ?Your physician recommends that you schedule a follow-up appointment in: 1 year. You will receive a reminder call in the mail in about 10 months reminding you to call and schedule your appointment.  If you don't receive this call, please contact our office. ? ?Any Other Special Instructions Will Be Listed Below (If Applicable). ?You have been referred to Neurology ? ?If you need a refill on your cardiac medications before your next appointment, please call your pharmacy. ?

## 2021-04-23 ENCOUNTER — Telehealth: Payer: Self-pay

## 2021-04-23 NOTE — Telephone Encounter (Signed)
Called today to follow up with client post op from last week on 04/15/21 by Dr Pete Glatter urologist. ?[facility] client reports that he is healing well and having no issues at this time. He is awaiting the test results from the surgery and reports the MD told him he would call him and post results to My Chart. He reports he follows up on 05/16/21 and knows he may call before then if he has any issues. ?Next PCP appointment with [facility] is 09/15/21. Instructed client that if he has medical need or illness or questions before August he can call [facility] for guidance and to be seen if needed. ?He reports also that he has had recent visits with cardiology and that so far the tests have been normal. He is supposed to get a cardiac monitor soon to evaluate episodes he has been having where he reports his arm gets weak and has trouble speaking. He states that his cardiologist is going to recommend to his primary care a possible referral to Neurologist to "that I may be having seizures" ?Will continue to follow as needed. Client knows he can call this RN or [facility] if he has questions or needs that we can assist him with. ?He reports no needs at this time.  ? ?Francee Nodal RN ?[employer] ?

## 2021-04-24 DIAGNOSIS — R002 Palpitations: Secondary | ICD-10-CM

## 2021-05-16 ENCOUNTER — Encounter: Payer: Self-pay | Admitting: Urology

## 2021-05-16 ENCOUNTER — Ambulatory Visit (INDEPENDENT_AMBULATORY_CARE_PROVIDER_SITE_OTHER): Payer: Self-pay | Admitting: Urology

## 2021-05-16 VITALS — BP 136/91 | HR 73

## 2021-05-16 DIAGNOSIS — Z872 Personal history of diseases of the skin and subcutaneous tissue: Secondary | ICD-10-CM

## 2021-05-16 DIAGNOSIS — Z9889 Other specified postprocedural states: Secondary | ICD-10-CM

## 2021-05-16 NOTE — Progress Notes (Signed)
? ?  Assessment: ?1. History of excision of epidermal inclusion cyst of penis   ? ? ?Plan: ?I discussed the pathology findings with the patient showing a benign epidermal inclusion cyst.  No additional treatment necessary. ?Return to office as needed. ? ?Chief Complaint:  ?Chief Complaint  ?Patient presents with  ? Follow-up  ? ? ?History of Present Illness: ? ?Trevor Pacheco is a 36 y.o. year old male who is seen for further evaluation of lesion on penis. ?He reported noting a lesion on his foreskin in November 2022.  He was concerned about a possible insect bite.  He noted some intermittent drainage from the area.  No significant pain.  The lesion did not increase  ? in size.  He was seen at Urgent Care and underwent incision and drainage of "cyst" on 02/27/21. ?Chlamydia, gonococcus, trichomonas, and syphilis testing all negative. ?He was treated with doxycycline.  He has been cleaning the area and applying topical antibiotic ointment.   ?He continued to have intermittent drainage from the penile lesion, occurring every 2-3 days.   ?He underwent excision of the lesion on 04/15/2021.  Pathology showed an epidermal inclusion cyst. ?The sutures were accidentally pulled out while he was sleeping and he had associated bleeding.  This resolved.  The wound is healing well.  He is not having any further drainage.  No significant discomfort. ? ? ?Portions of the above documentation were copied from a prior visit for review purposes only. ? ? ?Past Medical History:  ?Past Medical History:  ?Diagnosis Date  ? Heart murmur   ? ? ?Past Surgical History:  ?Past Surgical History:  ?Procedure Laterality Date  ? ABSCESS DRAINAGE    ? HAND SURGERY    ? LESION DESTRUCTION N/A 04/15/2021  ? Procedure: LESION DESTRUCTION PENIS;  Surgeon: Milderd Meager., MD;  Location: [facility];  Service: Urology;  Laterality: N/A;  ? ? ?Allergies:  ?Allergies  ?Allergen Reactions  ? Clindamycin/Lincomycin Other (See Comments)  ?  ?  Pt says it  caused an infection in his throat)  ? Penicillins Nausea And Vomiting  ? ? ?Family History:  ?Family History  ?Problem Relation Age of Onset  ? Cirrhosis Father   ? Alcoholism Father   ? Diabetes Maternal Aunt   ? ? ?Social History:  ?Social History  ? ?Tobacco Use  ? Smoking status: Every Day  ?  Packs/day: 0.25  ?  Types: Cigarettes  ? Smokeless tobacco: Never  ?Vaping Use  ? Vaping Use: Never used  ?Substance Use Topics  ? Alcohol use: Not Currently  ?  Comment: hx heavy etoh.  none since age 74  ? Drug use: Yes  ?  Types: Marijuana  ? ? ?ROS: ?Constitutional:  Negative for fever, chills, weight loss ?CV: Negative for chest pain, previous MI, hypertension ?Respiratory:  Negative for shortness of breath, wheezing, sleep apnea, frequent cough ?GI:  Negative for nausea, vomiting, bloody stool, GERD ? ?Physical exam: ?BP (!) 136/91   Pulse 73  ?GU: Healing incision on right penile shaft without evidence of infection. ? ?Results: ?None ?

## 2021-05-16 NOTE — Addendum Note (Signed)
Addended byGustavus Messing on: 05/16/2021 03:49 PM ? ? Modules accepted: Orders ? ?

## 2021-05-17 LAB — URINALYSIS, ROUTINE W REFLEX MICROSCOPIC
Bilirubin, UA: NEGATIVE
Glucose, UA: NEGATIVE
Ketones, UA: NEGATIVE
Leukocytes,UA: NEGATIVE
Nitrite, UA: NEGATIVE
Protein,UA: NEGATIVE
RBC, UA: NEGATIVE
Specific Gravity, UA: 1.01 (ref 1.005–1.030)
Urobilinogen, Ur: 0.2 mg/dL (ref 0.2–1.0)
pH, UA: 6 (ref 5.0–7.5)

## 2021-06-10 ENCOUNTER — Telehealth: Payer: Self-pay

## 2021-06-10 NOTE — Telephone Encounter (Signed)
Returned call to Client , He states he needs transportation to [facility] on 06/13/21 for an 0800 appointment. ? ?Explained to client that the number and [facility] transportation department has closed and that is the reason he is not getting a return call from them. He last accessed that department last month.  ?Explained new process and to call this RN or the [facility] mainline to receive assistance. He states understanding. Client has no one that can transport him from friends or family, will proceed with making arrangements for transportation.He understands he will get a call the day before with information regarding pick up times and address confirmation. ? ?Francee Nodal RN ?Clara Gunn/[facility] ? ?

## 2021-06-13 ENCOUNTER — Ambulatory Visit (INDEPENDENT_AMBULATORY_CARE_PROVIDER_SITE_OTHER): Payer: Self-pay | Admitting: Neurology

## 2021-06-13 ENCOUNTER — Telehealth: Payer: Self-pay | Admitting: Neurology

## 2021-06-13 ENCOUNTER — Telehealth: Payer: Self-pay

## 2021-06-13 ENCOUNTER — Encounter: Payer: Self-pay | Admitting: Neurology

## 2021-06-13 VITALS — BP 118/79 | HR 91 | Ht 73.0 in | Wt 165.5 lb

## 2021-06-13 DIAGNOSIS — R410 Disorientation, unspecified: Secondary | ICD-10-CM

## 2021-06-13 MED ORDER — LAMOTRIGINE 25 MG PO TABS: ORAL_TABLET | ORAL | 0 refills | Status: DC

## 2021-06-13 MED ORDER — LAMOTRIGINE 100 MG PO TABS: 100.0000 mg | ORAL_TABLET | Freq: Two times a day (BID) | ORAL | 3 refills | Status: DC

## 2021-06-13 MED ORDER — LAMOTRIGINE 100 MG PO TABS
100.0000 mg | ORAL_TABLET | Freq: Two times a day (BID) | ORAL | 3 refills | Status: DC
Start: 2021-06-13 — End: 2022-05-06

## 2021-06-13 NOTE — Telephone Encounter (Signed)
Called back. He is not sure what the name of the assistance program is. He tried looking for paper but unable to locate while on the phone. He is going to keep looking and will call back once he finds it.   He thinks it is called [facility]? I was able to locate this pharmacy in [place] but will wait on call back from pt to confirm.

## 2021-06-13 NOTE — Telephone Encounter (Signed)
Took call from phone staff and spoke w/ pt. He confirmed pharmacy where he gets free drug is [facility] [place], [address], [place] [address]. He was able to get someone to pay for lamotrigine 25mg  prescription #84. He will start with this. I e-scribed lamotrigine 100mg  to above pharmacy. He has their phone and fax#.  He wanted this to be the only pharmacy on file. I updated this in his chart.

## 2021-06-13 NOTE — Telephone Encounter (Signed)
self pay sent to [facility] they will call patient to schedule

## 2021-06-13 NOTE — Progress Notes (Signed)
Chief Complaint  Patient presents with   New Patient (Initial Visit)    Rm 14. Alone. NP internal referral for syncope, left arm numbness.      ASSESSMENT AND PLAN  Trevor Pacheco is a 36 y.o. male   Frequent transient confusional episode, Left-sided headache, neck pain  Anxious looking, bilateral hands action tremor, hyperreflexia, bilateral ankle clonus, bilateral Babinski signs,  Suspicious for partial seizure  MRI of the brain with without contrast, EEG  Laboratory evaluations,  Empirically treated with lamotrigine titrating to 100 mg twice a day  Return to clinic with nurse practitioner in 4 weeks  DIAGNOSTIC DATA (LABS, IMAGING, TESTING) - I reviewed patient records, labs, notes, testing and imaging myself where available.   MEDICAL HISTORY:  Trevor Pacheco, 36 year old male seen in request by   his primary care physician Dr. Bjorn Pippin, Tanna [place]oy, for evaluation of recurrent episode of confusion spells, left-sided headaches, initial evaluation was on Jun 13, 2021  I reviewed and summarized the referring note. PMHX.  Patient was brought in by transportation today, he has lost his job since 2021 due to frequent confusion spells, transient episode of zoning out, could not remember things, could not keep up with his job duty, it can happen multiple times in the day, lasting for few minutes,  Also complains of frequent left-sided headaches, splitting headache," as if there is a crack in my scalp," also have neck pain,   He was not able to have evaluation in the past due to financial issues, now he is on 100% [place] current sponsored care  PHYSICAL EXAM:   Vitals:   06/13/21 0744  BP: 118/79  Pulse: 91  Weight: 165 lb 8 oz (75.1 kg)  Height: 6\' 1"  (1.854 m)  Function Body mass index is 21.84 kg/m.  PHYSICAL EXAMNIATION:  Gen: NAD, conversant, well nourised, well groomed                     Cardiovascular: Regular rate rhythm, no peripheral edema,  warm, nontender. Eyes: Conjunctivae clear without exudates or hemorrhage Neck: Supple, no carotid bruits. Pulmonary: Clear to auscultation bilaterally   NEUROLOGICAL EXAM:  MENTAL STATUS: Speech/cognition: Anxious looking on The young man,   oriented to history taking and casual conversation CRANIAL NERVES: CN II: Visual fields are full to confrontation. Pupils are round equal and briskly reactive to light. CN III, IV, VI: extraocular movement are normal. No ptosis. CN V: Facial sensation is intact to light touch CN VII: Face is symmetric with normal eye closure  CN VIII: Hearing is normal to causal conversation. CN IX, X: Phonation is normal. CN XI: Head turning and shoulder shrug are intact  MOTOR: Mild bilateral hand action tremor, no weakness,  REFLEXES: Reflexes are 3 and symmetric at the biceps, triceps, knees, and ankles clonus. Plantar responses are extensor bilaterally  SENSORY: Intact to light touch, pinprick and vibratory sensation are intact in fingers and toes.  COORDINATION: There is no trunk or limb dysmetria noted.  GAIT/STANCE: He can get up from seated position, arm crossed, steady  REVIEW OF SYSTEMS:  Full 14 system review of systems performed and notable only for as above All other review of systems were negative.   ALLERGIES: Allergies  Allergen Reactions   Clindamycin/Lincomycin Other (See Comments)    ?  Pt says it caused an infection in his throat)   Penicillins Nausea And Vomiting    HOME MEDICATIONS: Current Outpatient Medications  Medication Sig Dispense  Refill   acetaminophen (TYLENOL) 500 MG tablet Take 1,000 mg by mouth every 6 (six) hours as needed for mild pain or moderate pain.     aspirin 325 MG tablet Take 325 mg by mouth every other day.     ibuprofen (ADVIL,MOTRIN) 600 MG tablet Take 1 tablet (600 mg total) by mouth 4 (four) times daily. 30 tablet 0   No current facility-administered medications for this visit.    PAST  MEDICAL HISTORY: Past Medical History:  Diagnosis Date   Heart murmur     PAST SURGICAL HISTORY: Past Surgical History:  Procedure Laterality Date   ABSCESS DRAINAGE     HAND SURGERY     LESION DESTRUCTION N/A 04/15/2021   Procedure: LESION DESTRUCTION PENIS;  Surgeon: Milderd Meager., MD;  Location: [facility];  Service: Urology;  Laterality: N/A;    FAMILY HISTORY: Family History  Problem Relation Age of Onset   Cirrhosis Father    Alcoholism Father    Diabetes Maternal Aunt     SOCIAL HISTORY: Social History   Socioeconomic History   Marital status: Single    Spouse name: Not on file   Number of children: Not on file   Years of education: Not on file   Highest education level: Not on file  Occupational History   Not on file  Tobacco Use   Smoking status: Every Day    Packs/day: 0.25    Types: Cigarettes   Smokeless tobacco: Never  Vaping Use   Vaping Use: Never used  Substance and Sexual Activity   Alcohol use: Not Currently    Comment: hx heavy etoh.  none since age 64   Drug use: Yes    Types: Marijuana   Sexual activity: Not on file  Other Topics Concern   Not on file  Social History Narrative   Not on file   Social Determinants of Health   Financial Resource Strain: Not on file  Food Insecurity: Not on file  Transportation Needs: Unmet Transportation Needs   Lack of Transportation (Medical): Yes   Lack of Transportation (Non-Medical): Yes  Physical Activity: Not on file  Stress: Not on file  Social Connections: Not on file  Intimate Partner Violence: Not on file      Levert Feinstein, M.D. Ph.D.  [facility] [address], [address] [place], [place] [address] Ph: [phone] Fax: [phone]  CC:  Little Ishikawa, MD [address] [place],  [place] [address]  Jacquelin Hawking, PA-C

## 2021-06-13 NOTE — Telephone Encounter (Signed)
Client called regarding his recent neurology appointment. He states he was prescribed Lamotrigine and is unsure if he can afford it. Called The drug store in [place] and cost of 25 mg tablets for #84 is 20$ and client is able to purchase that.  The medication is also available through [facility] and the 100mg  prescription could be filled there if the neurologist would send it there, there would be no cost to the client. He has tried to call that office also. He shares that he needs transportation to his upcoming visit back to [facility] on 06/19/21 will reach out to the practice to let them know he needs assistane. Client is agreeable.  Plan: contact [facility] regarding prescription as well as need for Transportation.  Patrica 06/21/21 RN Clara Jarold Motto

## 2021-06-13 NOTE — Telephone Encounter (Signed)
[facility], Case worker told to me let you know  lamoTRIgine (LAMICTAL) 100 MG tablet can be sent to medical assistance. Received a letter from Medical Assistance have been approve to pay for medication. Would like a call back.

## 2021-06-18 ENCOUNTER — Telehealth: Payer: Self-pay

## 2021-06-18 NOTE — Telephone Encounter (Signed)
Patient called and requested I send his MRI to [facility]. I sent it to them and they will call him to schedule. I cancelled his appointment at GI.

## 2021-06-18 NOTE — Telephone Encounter (Signed)
Called client for follow up. He states he has been scheduled for an MRI in [place] for 07/02/21 and needs transportation. Client is going to call Neurologist to see if his MRI can be scheduled at [facility] to help with transportation issues. He will let this RN know. Discussed that we will help with transportation. He states that [facility] neurolog[facility] also is assisting with transportation to his EEG appointment.  Will await decision on MRI and then assist with transportation. Client does state he received his 100 mg Lamictal in the mail from [facility]. Again discussed with client that any new provider he goes to , remind them he is eligible with [facility]. Discussed again not all medications are available but many are and it is always worth the provider checking to decrease financial burden of affording medications.  Will continue to follow for other needs.  Francee Nodal RN [facility] [employer]

## 2021-06-19 ENCOUNTER — Ambulatory Visit (INDEPENDENT_AMBULATORY_CARE_PROVIDER_SITE_OTHER): Payer: Self-pay | Admitting: Neurology

## 2021-06-19 DIAGNOSIS — R410 Disorientation, unspecified: Secondary | ICD-10-CM

## 2021-06-20 ENCOUNTER — Telehealth: Payer: Self-pay

## 2021-06-20 NOTE — Telephone Encounter (Signed)
Arranged transportation with [facility] transportation for 07/02/21 for MRI at 7 am at [facility]. Client is to arrive by 630 no later than 645am therefore [facility] could accommodate. Client notified via text as previously discussed.  Francee Nodal RN [facility] [employer]

## 2021-06-21 LAB — CBC WITH DIFFERENTIAL/PLATELET
Basophils Absolute: 0.1 10*3/uL (ref 0.0–0.2)
Basos: 1 %
EOS (ABSOLUTE): 0.2 10*3/uL (ref 0.0–0.4)
Eos: 2 %
Hematocrit: 43.8 % (ref 37.5–51.0)
Hemoglobin: 15.1 g/dL (ref 13.0–17.7)
Immature Grans (Abs): 0 10*3/uL (ref 0.0–0.1)
Immature Granulocytes: 0 %
Lymphocytes Absolute: 1.8 10*3/uL (ref 0.7–3.1)
Lymphs: 14 %
MCH: 31.1 pg (ref 26.6–33.0)
MCHC: 34.5 g/dL (ref 31.5–35.7)
MCV: 90 fL (ref 79–97)
Monocytes Absolute: 0.9 10*3/uL (ref 0.1–0.9)
Monocytes: 7 %
Neutrophils Absolute: 9.3 10*3/uL — ABNORMAL HIGH (ref 1.4–7.0)
Neutrophils: 76 %
Platelets: 322 10*3/uL (ref 150–450)
RBC: 4.86 x10E6/uL (ref 4.14–5.80)
RDW: 13 % (ref 11.6–15.4)
WBC: 12.3 10*3/uL — ABNORMAL HIGH (ref 3.4–10.8)

## 2021-06-21 LAB — COMPREHENSIVE METABOLIC PANEL
ALT: 25 IU/L (ref 0–44)
AST: 23 IU/L (ref 0–40)
Albumin/Globulin Ratio: 1.7 (ref 1.2–2.2)
Albumin: 4.8 g/dL (ref 4.0–5.0)
Alkaline Phosphatase: 53 IU/L (ref 44–121)
BUN/Creatinine Ratio: 14 (ref 9–20)
BUN: 11 mg/dL (ref 6–20)
Bilirubin Total: 0.6 mg/dL (ref 0.0–1.2)
CO2: 24 mmol/L (ref 20–29)
Calcium: 9.9 mg/dL (ref 8.7–10.2)
Chloride: 101 mmol/L (ref 96–106)
Creatinine, Ser: 0.77 mg/dL (ref 0.76–1.27)
Globulin, Total: 2.8 g/dL (ref 1.5–4.5)
Glucose: 94 mg/dL (ref 70–99)
Potassium: 4 mmol/L (ref 3.5–5.2)
Sodium: 141 mmol/L (ref 134–144)
Total Protein: 7.6 g/dL (ref 6.0–8.5)
eGFR: 120 mL/min/{1.73_m2} (ref >59)

## 2021-06-21 LAB — C-REACTIVE PROTEIN: CRP: 1 mg/L (ref 0–10)

## 2021-06-21 LAB — DRUG SCREEN 10 W/CONF, SERUM
Amphetamines, IA: NEGATIVE ng/mL
Barbiturates, IA: NEGATIVE ug/mL
Benzodiazepines, IA: NEGATIVE ng/mL
Cocaine & Metabolite, IA: NEGATIVE ng/mL
Methadone, IA: NEGATIVE ng/mL
Opiates, IA: NEGATIVE ng/mL
Oxycodones, IA: NEGATIVE ng/mL
Phencyclidine, IA: NEGATIVE ng/mL
Propoxyphene, IA: NEGATIVE ng/mL
THC(Marijuana) Metabolite, IA: POSITIVE ng/mL — AB

## 2021-06-21 LAB — ANA W/REFLEX IF POSITIVE
Anti JO-1: <0.2 AI (ref 0.0–0.9)
Anti Nuclear Antibody (ANA): POSITIVE — AB
Centromere Ab Screen: <0.2 AI (ref 0.0–0.9)
Chromatin Ab SerPl-aCnc: <0.2 AI (ref 0.0–0.9)
ENA RNP Ab: 5.3 AI — ABNORMAL HIGH (ref 0.0–0.9)
ENA SM Ab Ser-aCnc: <0.2 AI (ref 0.0–0.9)
ENA SSA (RO) Ab: <0.2 AI (ref 0.0–0.9)
ENA SSB (LA) Ab: <0.2 AI (ref 0.0–0.9)
Scleroderma (Scl-70) (ENA) Antibody, IgG: <0.2 AI (ref 0.0–0.9)
dsDNA Ab: <1 IU/mL (ref 0–9)

## 2021-06-21 LAB — ETHANOL

## 2021-06-21 LAB — THC,MS,WB/SP RFX
Cannabidiol: NEGATIVE ng/mL
Cannabinoid Confirmation: POSITIVE
Carboxy-THC: 58.1 ng/mL
Hydroxy-THC: NEGATIVE ng/mL
Tetrahydrocannabinol(THC): 2.8 ng/mL

## 2021-06-21 LAB — RPR: RPR Ser Ql: NONREACTIVE

## 2021-06-21 LAB — SEDIMENTATION RATE: Sed Rate: 3 mm/hr (ref 0–15)

## 2021-06-21 LAB — FOLATE: Folate: 3.7 ng/mL (ref >3.0)

## 2021-06-21 LAB — HIV ANTIBODY (ROUTINE TESTING W REFLEX): HIV Screen 4th Generation wRfx: NONREACTIVE

## 2021-06-21 LAB — VITAMIN B12: Vitamin B-12: 254 pg/mL (ref 232–1245)

## 2021-06-21 LAB — TSH: TSH: 1.66 u[IU]/mL (ref 0.450–4.500)

## 2021-06-23 ENCOUNTER — Telehealth: Payer: Self-pay | Admitting: Neurology

## 2021-06-23 DIAGNOSIS — R410 Disorientation, unspecified: Secondary | ICD-10-CM

## 2021-06-23 NOTE — Telephone Encounter (Addendum)
Please call patient, laboratory evaluation showed --- Positive marijuana --- Mild elevated WBC with mildly elevated neutrophil make sure he does not have any signs of infection, ----Positive ANA with elevated ENA RNP antibody of unknown clinical significance  --- Low normal B12 254, he will benefit over-the-counter B12 supplement 1000 mcg daily  -----EEG was normal,  But there was evidence of irregular cardiac rhythm with refer him to cardiologist for evaluation

## 2021-06-23 NOTE — Procedures (Signed)
   HISTORY: 36 year old male, presented with frequent transient confusional episode  TECHNIQUE:  This is a routine 16 channel EEG recording with one channel devoted to a limited EKG recording.  It was performed during wakefulness, drowsiness and asleep.  Hyperventilation and photic stimulation were performed as activating procedures.  There are minimum muscle and movement artifact noted.  Upon maximum arousal, posterior dominant waking rhythm consistent of rhythmic alpha range activity. Activities are symmetric over the bilateral posterior derivations and attenuated with eye opening.  Hyperventilation produced mild/moderate buildup with higher amplitude and the slower activities noted.  Photic stimulation did not alter the tracing.  During EEG recording, patient developed drowsiness and no deeper stage of sleep was achieved  During EEG recording, there was no epileptiform discharge noted.  EKG demonstrate irregular heart rhythm, 60 bpm  CONCLUSION: This is a  normal EEG.  There is no electrodiagnostic evidence of epileptiform discharge.  Levert Feinstein, M.D. Ph.D.  [facility] [address] [place], [place] [address] Phone: [phone] Fax:      [phone]

## 2021-06-24 NOTE — Addendum Note (Signed)
Addended by: Marcial Pacas on: 06/24/2021 04:33 PM   Modules accepted: Orders

## 2021-06-25 NOTE — Telephone Encounter (Signed)
I called patient. I discussed his lab results and recommendations. Patient reports that he is already being followed by cardiology. He has his MRI on 07/02/21 and we will call him with those results. Pt verbalized understanding of results. Pt had no questions at this time but was encouraged to call back if questions arise.

## 2021-07-01 ENCOUNTER — Other Ambulatory Visit: Payer: Self-pay | Admitting: Adult Health

## 2021-07-02 ENCOUNTER — Ambulatory Visit (HOSPITAL_COMMUNITY)
Admission: RE | Admit: 2021-07-02 | Discharge: 2021-07-02 | Disposition: A | Discharge status: Home / Self Care | Payer: Self-pay | Source: Ambulatory Visit | Attending: Neurology | Admitting: Neurology

## 2021-07-02 ENCOUNTER — Other Ambulatory Visit: Payer: Self-pay

## 2021-07-02 DIAGNOSIS — R410 Disorientation, unspecified: Secondary | ICD-10-CM | POA: Insufficient documentation

## 2021-07-02 MED ORDER — GADOBUTROL 1 MMOL/ML IV SOLN
7.0000 mL | Freq: Once | INTRAVENOUS | Status: AC | PRN
  Administered 2021-07-02: 7 mL via INTRAVENOUS

## 2021-07-17 ENCOUNTER — Encounter: Payer: Self-pay | Admitting: Adult Health

## 2021-07-17 ENCOUNTER — Ambulatory Visit (INDEPENDENT_AMBULATORY_CARE_PROVIDER_SITE_OTHER): Payer: Self-pay | Admitting: Adult Health

## 2021-07-17 VITALS — BP 123/74 | HR 65 | Ht 73.0 in | Wt 172.6 lb

## 2021-07-17 DIAGNOSIS — Z5181 Encounter for therapeutic drug level monitoring: Secondary | ICD-10-CM

## 2021-07-17 DIAGNOSIS — R410 Disorientation, unspecified: Secondary | ICD-10-CM

## 2021-07-17 DIAGNOSIS — M79609 Pain in unspecified limb: Secondary | ICD-10-CM

## 2021-07-17 DIAGNOSIS — R202 Paresthesia of skin: Secondary | ICD-10-CM

## 2021-07-17 MED ORDER — LAMOTRIGINE 25 MG PO TABS: 25.0000 mg | ORAL_TABLET | Freq: Two times a day (BID) | ORAL | 5 refills | Status: DC

## 2021-07-17 NOTE — Patient Instructions (Signed)
Your Plan:  Increase lamictal to 125 mg twice a day. Called in a 25 mg tablet to take in addition to the 100 mg If your symptoms worsen or you develop new symptoms please let us know.    Thank you for coming to see [place] at [facility]. I hope we have been able to provide you high quality care today.  You may receive a patient satisfaction survey over the next few weeks. We would appreciate your feedback and comments so that we may continue to improve ourselves and the health of our patients.

## 2021-07-17 NOTE — Progress Notes (Addendum)
PATIENT: Trevor Pacheco DOB: 07/04/1985  REASON FOR VISIT: follow up HISTORY FROM: patient PRIMARY NEUROLOGIST: Dr. Terrace Arabia  Chief Complaint  Patient presents with   Follow-up    Pt in 19  Pt is here tremor follow up pt states he stressed . Pt states that since he started Lamictal tremors have  decreased .      HISTORY OF PRESENT ILLNESS: Today 07/17/21: Mr. Trevor Pacheco is a 36 year old male with a history of confusional events, transient episode of zoning out and pain on the left side of the body.  He returns today for follow-up.  He reports that the zoning out and confusional events have subsided since he started on Lamictal.  He states that if he gets emotional he will have pain that shoots down the left side of the body.  He states he will have numbness and tingling in the left arm.  He states that this also can occur mainly but never consistently.  He does notice some improvement since starting Lamictal.  He does feel that he has some anxiety.  Reports that he has been in mental health in the past but was not started on any medication.   HISTORY Trevor Pacheco, 36-yehar-old male seen in request by   his primary care physician Dr. Little Ishikawa, for evaluation of recurrent episode of confusion spells, left-sided headaches, initial evaluation was on Jun 13, 2021   I reviewed and summarized the referring note. PMHX.   Patient was brought in by transportation today, he has lost his job since 2021 due to frequent confusion spells, transient episode of zoning out, could not remember things, could not keep up with his job duty, it can happen multiple times in the day, lasting for few minutes,  Also complains of frequent left-sided headaches, splitting headache," as if there is a crack in my scalp," also have neck pain,   He was not able to have evaluation in the past due to financial issues, now he is on 100% [place] current sponsored care  REVIEW OF SYSTEMS: Out of a complete  14 system review of symptoms, the patient complains only of the following symptoms, and all other reviewed systems are negative.  ALLERGIES: Allergies  Allergen Reactions   Clindamycin/Lincomycin Other (See Comments)    ?  Pt says it caused an infection in his throat)   Penicillins Nausea And Vomiting    HOME MEDICATIONS: Outpatient Medications Prior to Visit  Medication Sig Dispense Refill   acetaminophen (TYLENOL) 500 MG tablet Take 1,000 mg by mouth every 6 (six) hours as needed for mild pain or moderate pain.     aspirin 325 MG tablet Take 325 mg by mouth every other day.     ibuprofen (ADVIL,MOTRIN) 600 MG tablet Take 1 tablet (600 mg total) by mouth 4 (four) times daily. 30 tablet 0   lamoTRIgine (LAMICTAL) 100 MG tablet Take 1 tablet (100 mg total) by mouth 2 (two) times daily. 180 tablet 3   lamoTRIgine (LAMICTAL) 25 MG tablet 1 twice a day x1wk 2 twice a day x 2nd wk 3 twice a day x3rd wk 84 tablet 0   No facility-administered medications prior to visit.    PAST MEDICAL HISTORY: Past Medical History:  Diagnosis Date   Heart murmur     PAST SURGICAL HISTORY: Past Surgical History:  Procedure Laterality Date   ABSCESS DRAINAGE     HAND SURGERY     LESION DESTRUCTION N/A 04/15/2021   Procedure:  LESION DESTRUCTION PENIS;  Surgeon: Milderd Meager., MD;  Location: [facility];  Service: Urology;  Laterality: N/A;    FAMILY HISTORY: Family History  Problem Relation Age of Onset   Cirrhosis Father    Alcoholism Father    Seizures Father    Diabetes Maternal Aunt     SOCIAL HISTORY: Social History   Socioeconomic History   Marital status: Single    Spouse name: Not on file   Number of children: Not on file   Years of education: Not on file   Highest education level: Not on file  Occupational History   Not on file  Tobacco Use   Smoking status: Every Day    Packs/day: 0.50    Types: Cigarettes   Smokeless tobacco: Never  Vaping Use   Vaping Use: Never  used  Substance and Sexual Activity   Alcohol use: Not Currently    Comment: hx heavy etoh.  none since age 27   Drug use: Yes    Types: Marijuana   Sexual activity: Not on file  Other Topics Concern   Not on file  Social History Narrative   Not on file   Social Determinants of Health   Financial Resource Strain: Not on file  Food Insecurity: Not on file  Transportation Needs: Unmet Transportation Needs (06/20/2021)   PRAPARE - Administrator, Civil Service (Medical): Yes    Lack of Transportation (Non-Medical): Yes  Physical Activity: Not on file  Stress: Not on file  Social Connections: Not on file  Intimate Partner Violence: Not on file      PHYSICAL EXAM  Vitals:   07/17/21 0837  BP: 123/74  Pulse: 65  Weight: 172 lb 9.6 oz (78.3 kg)  Height: 6\' 1"  (1.854 m)   Body mass index is 22.77 kg/m.  Generalized: Well developed, in no acute distress   Neurological examination  Mentation: Alert oriented to time, place, history taking. Follows all commands speech and language fluent Cranial nerve II-XII: Pupils were equal round reactive to light. Extraocular movements were full, visual field were full on confrontational test. Facial sensation and strength were normal. Uvula tongue midline. Head turning and shoulder shrug  were normal and symmetric. Motor: The motor testing reveals 5 over 5 strength of all 4 extremities. Good symmetric motor tone is noted throughout.  Sensory: Sensory testing is intact to soft touch on all 4 extremities. Weakness noted in left ankle. No evidence of extinction is noted.  Coordination: Cerebellar testing reveals good finger-nose-finger and heel-to-shin bilaterally.  Gait and station: Gait is normal with slight limp. Tandem gait not attempted.    DIAGNOSTIC DATA (LABS, IMAGING, TESTING) - I reviewed patient records, labs, notes, testing and imaging myself where available.  Lab Results  Component Value Date   WBC 12.3 (H)  06/13/2021   HGB 15.1 06/13/2021   HCT 43.8 06/13/2021   MCV 90 06/13/2021   PLT 322 06/13/2021      Component Value Date/Time   NA 141 06/13/2021 0821   K 4.0 06/13/2021 0821   CL 101 06/13/2021 0821   CO2 24 06/13/2021 0821   GLUCOSE 94 06/13/2021 0821   GLUCOSE 107 (H) 01/08/2021 0937   BUN 11 06/13/2021 0821   CREATININE 0.77 06/13/2021 0821   CALCIUM 9.9 06/13/2021 0821   PROT 7.6 06/13/2021 0821   ALBUMIN 4.8 06/13/2021 0821   AST 23 06/13/2021 0821   ALT 25 06/13/2021 0821   ALKPHOS 53 06/13/2021 06/15/2021  BILITOT 0.6 06/13/2021 0821   GFRNONAA >60 01/08/2021 6314   Lab Results  Component Value Date   CHOL 176 10/15/2020   HDL 30 (L) 10/15/2020   LDLCALC 121 (H) 10/15/2020   TRIG 127 10/15/2020   CHOLHDL 5.9 10/15/2020   Lab Results  Component Value Date   HGBA1C 4.7 (L) 10/15/2020   Lab Results  Component Value Date   VITAMINB12 254 06/13/2021   Lab Results  Component Value Date   TSH 1.660 06/13/2021      ASSESSMENT AND PLAN 36 y.o. year old male  has a past medical history of Heart murmur. here with:  1.  Confusional events 2.  Left-sided numbness and tingling usually related to emotional response  Patient has got some improvement with Lamictal.  We will increase the dose to 125 mg twice a day.  I will check Lamictal level today.  Advised that he can discuss anxiety with his PCP however Lamictal may offer him some benefit as it is a mood stabilizer.  He is advised to follow-up with our office in 6 months through MyChart.     Butch Penny, MSN, NP-C 07/17/2021, 9:00 AM [facility] [address] [place], [place] [address] [phone]

## 2021-07-19 LAB — LAMOTRIGINE LEVEL: Lamotrigine Lvl: 5.6 ug/mL (ref 2.0–20.0)

## 2021-07-22 ENCOUNTER — Telehealth: Payer: Self-pay | Admitting: Physician Assistant

## 2021-07-22 NOTE — Telephone Encounter (Signed)
Pt called office and left VM over the weekend stating that neurologist told him to get his PCP to rx anxiety medication.  Pt was called back and discussed that he was referred to [facility] due to his significant level of anxiety and need for a psychiatrist.  He says he remembers.  He is at [facility] now (with his girlfriend).  He will talk with someone there and get appointment for his anxiety.  He is told to call us back for any difficulties.

## 2021-07-31 ENCOUNTER — Telehealth: Payer: Self-pay

## 2021-07-31 NOTE — Telephone Encounter (Signed)
Returned call to client who is requesting transportation to re-establish at [facility]. He is requesting transport for 08/02/21 at 8am for intake. Arranged transportation with [facility]. Client notified.  Francee Nodal RN

## 2021-08-07 ENCOUNTER — Telehealth: Payer: Self-pay

## 2021-08-07 NOTE — Telephone Encounter (Signed)
Returned call to client and he states that he had to cancel his [facility] appointment for today. Stating he is at cardiologist office. He states he called [facility] transportation to let them know. He will notify [facility] staff when rescheduled for transportation. Client states he will do that. Will continue to follow client for support and transportation needs.   Francee Nodal RN [employer]

## 2021-08-28 ENCOUNTER — Telehealth: Payer: Self-pay

## 2021-08-28 NOTE — Telephone Encounter (Signed)
Received call from client as follow up as Juel Burrow was seeking clarification on transportation needs. Client states Floydene Flock has not rescheduled his visit yet. Encouraged him to call them to reschedule and let [facility] know so that we may arrange transportation. [facility] notified. Client states his cardiology appointment is now scheduled for October and his [facility] appointment is not until the end of the month. We did schedule him for [facility] and [facility]ist renewal for 09/03/21 at [facility] in home visit.   Will continue to follow as needed.  Francee Nodal RN [employer]

## 2021-09-08 ENCOUNTER — Encounter (HOSPITAL_COMMUNITY): Payer: Self-pay | Admitting: *Deleted

## 2021-09-08 ENCOUNTER — Emergency Department (HOSPITAL_COMMUNITY)
Admission: EM | Admit: 2021-09-08 | Discharge: 2021-09-08 | Disposition: A | Discharge status: Home / Self Care | Payer: Self-pay | Attending: Emergency Medicine | Admitting: Emergency Medicine

## 2021-09-08 ENCOUNTER — Other Ambulatory Visit: Payer: Self-pay

## 2021-09-08 DIAGNOSIS — F17210 Nicotine dependence, cigarettes, uncomplicated: Secondary | ICD-10-CM | POA: Insufficient documentation

## 2021-09-08 DIAGNOSIS — K047 Periapical abscess without sinus: Secondary | ICD-10-CM | POA: Insufficient documentation

## 2021-09-08 DIAGNOSIS — Z7982 Long term (current) use of aspirin: Secondary | ICD-10-CM | POA: Insufficient documentation

## 2021-09-08 HISTORY — DX: Unspecified convulsions: R56.9

## 2021-09-08 MED ORDER — CHLORHEXIDINE GLUCONATE 0.12 % MT SOLN
15.0000 mL | Freq: Two times a day (BID) | OROMUCOSAL | 0 refills | Status: DC
Start: 2021-09-08 — End: 2022-03-19

## 2021-09-08 MED ORDER — DOXYCYCLINE HYCLATE 100 MG PO CAPS: 100.0000 mg | ORAL_CAPSULE | Freq: Two times a day (BID) | ORAL | 0 refills | Status: DC

## 2021-09-08 MED ORDER — BENZOCAINE 20 % MT AERO
INHALATION_SPRAY | Freq: Once | OROMUCOSAL | Status: AC
  Filled 2021-09-08: qty 57

## 2021-09-08 MED ORDER — LIDOCAINE HCL (PF) 1 % IJ SOLN
5.0000 mL | Freq: Once | INTRAMUSCULAR | Status: AC
  Administered 2021-09-08: 5 mL
  Filled 2021-09-08: qty 5

## 2021-09-08 MED ORDER — OXYCODONE-ACETAMINOPHEN 5-325 MG PO TABS
1.0000 | ORAL_TABLET | Freq: Once | ORAL | Status: AC
  Administered 2021-09-08: 1 via ORAL
  Filled 2021-09-08: qty 1

## 2021-09-08 MED ORDER — CHLORHEXIDINE GLUCONATE 0.12 % MT SOLN: 15.0000 mL | Freq: Two times a day (BID) | OROMUCOSAL | 0 refills | Status: DC

## 2021-09-08 NOTE — ED Provider Notes (Signed)
[facility] EMERGENCY DEPARTMENT Provider Note  CSN: [id number] Arrival date & time: 09/08/21 0700  Chief Complaint(s) Dental Pain  HPI Trevor Pacheco is a 36 y.o. male with history of seizure disorder presenting with dental lesion.  Patient reports for the past 3 days he has noticed a wound on the roof of his mouth which is tender.  He denies fevers or chills.  Denies nausea or vomiting.  He reports it is very painful when he eats.  Denies pain to the floor of his mouth.  No difficulty with swallowing, breathing.   Past Medical History Past Medical History:  Diagnosis Date   Heart murmur    Seizure [facility])    Patient Active Problem List   Diagnosis Date Noted   Confusion 06/13/2021   Penile lesion 04/02/2021   Submandibular abscess 09/23/2016   Home Medication(s) Prior to Admission medications   Medication Sig Start Date End Date Taking? Authorizing Provider  chlorhexidine (PERIDEX) 0.12 % solution Use as directed 15 mLs in the mouth or throat 2 (two) times daily. 09/08/21  Yes Lonell Grandchild, MD  doxycycline (VIBRAMYCIN) 100 MG capsule Take 1 capsule (100 mg total) by mouth 2 (two) times daily. 09/08/21  Yes Lonell Grandchild, MD  acetaminophen (TYLENOL) 500 MG tablet Take 1,000 mg by mouth every 6 (six) hours as needed for mild pain or moderate pain.    [provider]  aspirin 325 MG tablet Take 325 mg by mouth every other day.    [provider]  ibuprofen (ADVIL,MOTRIN) 600 MG tablet Take 1 tablet (600 mg total) by mouth 4 (four) times daily. 11/24/17   Ivery Quale, PA-C  lamoTRIgine (LAMICTAL) 100 MG tablet Take 1 tablet (100 mg total) by mouth 2 (two) times daily. 06/13/21   Levert Feinstein, MD  lamoTRIgine (LAMICTAL) 25 MG tablet Take 1 tablet (25 mg total) by mouth 2 (two) times daily. 07/17/21   Butch Penny, NP                                                                                                                                    Past  Surgical History Past Surgical History:  Procedure Laterality Date   ABSCESS DRAINAGE     HAND SURGERY     LESION DESTRUCTION N/A 04/15/2021   Procedure: LESION DESTRUCTION PENIS;  Surgeon: Milderd Meager., MD;  Location: [facility];  Service: Urology;  Laterality: N/A;   Family History Family History  Problem Relation Age of Onset   Cirrhosis Father    Alcoholism Father    Seizures Father    Diabetes Maternal Aunt     Social History Social History   Tobacco Use   Smoking status: Every Day    Packs/day: 0.25    Types: Cigarettes   Smokeless tobacco: Never  Vaping Use   Vaping Use: Never used  Substance Use Topics   Alcohol use: Not Currently  Comment: hx heavy etoh.  none since age 79   Drug use: Yes    Types: Marijuana   Allergies Clindamycin/lincomycin and Penicillins  Review of Systems Review of Systems  All other systems reviewed and are negative.   Physical Exam Vital Signs  I have reviewed the triage vital signs BP (!) 130/99   Pulse 78   Temp 97.9 F (36.6 C) (Oral)   Resp 18   Ht 6\' 5"  (1.956 m)   Wt 74.8 kg   SpO2 100%   BMI 19.57 kg/m  Physical Exam Vitals and nursing note reviewed.  Constitutional:      Appearance: Normal appearance.  HENT:     Head: Normocephalic and atraumatic.     Right Ear: External ear normal.     Left Ear: External ear normal.     Mouth/Throat:     Mouth: Mucous membranes are moist.     Comments: Very poor dentition throughout.  On the roof of the mouth, there is approximately 1 cm fluctuant area of swelling just distal to the upper left canine.  Floor of mouth soft.  Uvula midline.  No stridor or respiratory distress.  Airway patent.  No sign of other dental abscess Eyes:     Conjunctiva/sclera: Conjunctivae normal.  Cardiovascular:     Rate and Rhythm: Normal rate.  Pulmonary:     Effort: Pulmonary effort is normal. No respiratory distress.     Breath sounds: No stridor.  Abdominal:     General: Abdomen is  flat.  Musculoskeletal:     Cervical back: Neck supple.  Skin:    General: Skin is warm and dry.  Neurological:     General: No focal deficit present.     Mental Status: He is alert and oriented to person, place, and time.  Psychiatric:        Mood and Affect: Mood normal.        Behavior: Behavior normal.     ED Results and Treatments Labs (all labs ordered are listed, but only abnormal results are displayed) Labs Reviewed - No data to display                                                                                                                        Radiology No results found.  Pertinent labs & imaging results that were available during my care of the patient were reviewed by me and considered in my medical decision making (see MDM for details).  Medications Ordered in ED Medications  Benzocaine (HURRCAINE) 20 % mouth spray ( Mouth/Throat Given by Other 09/08/21 0908)  lidocaine (PF) (XYLOCAINE) 1 % injection 5 mL (5 mLs Infiltration Given by Other 09/08/21 0908)  Procedures .Marland KitchenIncision and Drainage  Date/Time: 09/08/2021 9:24 AM  Performed by: Lonell Grandchild, MD Authorized by: Lonell Grandchild, MD   Consent:    Consent obtained:  Verbal   Consent given by:  Patient   Risks, benefits, and alternatives were discussed: yes     Risks discussed:  Bleeding, incomplete drainage, pain, damage to other organs and infection   Alternatives discussed:  No treatment Universal protocol:    Procedure explained and questions answered to patient or proxy's satisfaction: yes     Immediately prior to procedure, a time out was called: yes     Patient identity confirmed:  Verbally with patient, arm band and provided demographic data Location:    Type:  Abscess   Size:  1cm   Location:  Mouth   Mouth location:  Palate Sedation:    Sedation  type:  None Anesthesia:    Anesthesia method:  Topical application and local infiltration   Topical anesthetic:  Benzocaine gel   Local anesthetic:  Lidocaine 1% w/o epi Procedure type:    Complexity:  Simple Procedure details:    Ultrasound guidance: no     Needle aspiration: no     Incision types:  Single straight   Incision depth:  Submucosal   Wound management:  Probed and deloculated   Drainage:  Purulent and bloody   Drainage amount:  Moderate   Wound treatment:  Wound left open   Packing materials:  None Post-procedure details:    Procedure completion:  Tolerated well, no immediate complications   (including critical care time)  Medical Decision Making / ED Course   MDM:  35 year old male presenting to the emergency department with swelling to the roof of the mouth.  Patient overall well-appearing, vital signs reassuring without fever.  Exam with area of fluctuant swelling to the roof of the mouth just distal to the upper left canine, consistent with likely dental abscess.  No sign of Ludwigs's angina, peritonsillar abscess, retropharyngeal abscess.  No sign of airway compromise.  No systemic signs of infection.  Will perform bedside incision and drainage.  Likely discharge with antibiotics and Peridex with instructions to follow-up with dentist.     Lab Tests: -I ordered, reviewed, and interpreted labs.   The pertinent results include:   Labs Reviewed - No data to display    Medicines ordered and prescription drug management: Meds ordered this encounter  Medications   Benzocaine (HURRCAINE) 20 % mouth spray   lidocaine (PF) (XYLOCAINE) 1 % injection 5 mL   doxycycline (VIBRAMYCIN) 100 MG capsule    Sig: Take 1 capsule (100 mg total) by mouth 2 (two) times daily.    Dispense:  20 capsule    Refill:  0   chlorhexidine (PERIDEX) 0.12 % solution    Sig: Use as directed 15 mLs in the mouth or throat 2 (two) times daily.    Dispense:  120 mL    Refill:  0    -I  have reviewed the patients home medicines and have made adjustments as needed   Social Determinants of Health:  Factors impacting patients care include: smoking status   Reevaluation: After the interventions noted above, I reevaluated the patient and found that they have :improved  Co morbidities that complicate the patient evaluation  Past Medical History:  Diagnosis Date   Heart murmur    Seizure (HCC)       Dispostion: I considered admission for this patient, but given mild symptoms and no systemic  signs of infection will discharge to home.      Final Clinical Impression(s) / ED Diagnoses Final diagnoses:  Periapical abscess     This chart was dictated using voice recognition software.  Despite best efforts to proofread,  errors can occur which can change the documentation meaning.    Lonell Grandchild, MD 09/08/21 [phone]

## 2021-09-08 NOTE — ED Notes (Signed)
ED Provider at bedside. 

## 2021-09-08 NOTE — Discharge Instructions (Addendum)
We drained your abscess. Please return if you experience any worsening symptoms. Please follow up with a dentist as soon as possible.

## 2021-09-08 NOTE — ED Triage Notes (Signed)
Pt c/o pain to the left side roof of his mouth. Pt reports he has had an abscess for several days.

## 2021-09-10 ENCOUNTER — Telehealth: Payer: Self-pay

## 2021-09-10 NOTE — Telephone Encounter (Signed)
Follow up with client who was seen in the ER on 09/08/21 with an abscess that required I&D, client is taking his antibiotics. Client was placed on Dental wait list for [facility] in Jan 2023, however due to this need, [facility] to [facility] his [facility] visit. Discussed that only his provider can change his status on the Dental wait list to urgent. He states he will call and he understands.  Discussed with client also that he needs to call [facility] to reschedule his appointment as he has been waiting for them to call me. He states the provider at [facility] wanted to get information from Cardiologist, however than appointment has passed. At client's request texted him the [facility] number to call and follow up.  UPDATE: client called back stating he needs transportation for 09/17/21 to [facility]. Will arrange. He was upset though stating he called [facility] and was told they would not see him that "nothing was wrong with him and he just needed therapy" Client reports that he was [facility] due his anxiety and panic attacks that has caused his chest pains. He states he will let the provider at [facility] know when he goes on 09/17/21. I will attempt to follow up with [facility], but let client know that at times they will not discuss cases with me due to needs for release of information forms for HIPPA. He states understanding.   Food : client also has received letter from DSS that in September he will have his food stamps cut due to policies changing requiring recipients to work at least a minimum number of hours per week or have a letter stating why they are not able to work.  He feels very discouraged today, "I feel no one wants to help me so I can feel better" speaking to Mental health services.  Will continue to follow as needed for support and other resources. Will contact client once transportation arranged.   Francee Nodal RN [employer]

## 2021-09-17 ENCOUNTER — Encounter: Payer: Self-pay | Admitting: Physician Assistant

## 2021-09-17 ENCOUNTER — Ambulatory Visit: Payer: Self-pay | Admitting: Physician Assistant

## 2021-09-17 VITALS — BP 118/70 | HR 61 | Temp 96.1°F | Ht 73.0 in | Wt 172.0 lb

## 2021-09-17 DIAGNOSIS — F489 Nonpsychotic mental disorder, unspecified: Secondary | ICD-10-CM

## 2021-09-17 DIAGNOSIS — K029 Dental caries, unspecified: Secondary | ICD-10-CM

## 2021-09-17 DIAGNOSIS — F17200 Nicotine dependence, unspecified, uncomplicated: Secondary | ICD-10-CM

## 2021-09-17 NOTE — Progress Notes (Signed)
BP 118/70   Pulse 61   Temp (!) 96.1 F (35.6 C)   Ht 6\' 1"  (1.854 m)   Wt 172 lb (78 kg)   SpO2 97%   BMI 22.69 kg/m    Subjective:    Patient ID: , male    DOB: Jun 11, 1985, 36 y.o.   MRN: [id number]  HPI: Trevor Pacheco is a 36 y.o. male presenting on 09/17/2021 for Dental Problem (Pt has infection on roof of mouth going up to sinus. Pt states swelling has gone down but feels he still has some infection not cleared up. Pt states he has 2 days left of doxycycline)   HPI  Chief Complaint  Patient presents with   Dental Problem    Pt has infection on roof of mouth going up to sinus. Pt states swelling has gone down but feels he still has some infection not cleared up. Pt states he has 2 days left of doxycycline      He went to ER on 09/08/21 for dental pain.  He had I&D of abscess and got rx for doxycycline.   He still has 2 days more of the antibiotics.  It is improved but he says he still feels infection.    He is seeing neurologist for tremors and confusional events.  He is to follow up there in January.  Pt had unremarkable evaluation by cardiology and has appointment to follow up in October.    Pt was encouraged to contact [facility] for anxiety and stress.  At OV in November, he says he went to [facility] and was told he was normal and didn't neeed to return.   He saw [facility] here once in Decmeber 2022 but did not return to see [facility] again.   He says he still feels like he has panic attacks.  He says he went to [facility] last month.  He says psychiatrist wouldn't even see him.    He says he has panic attacks every day.   Nurse called [facility] and confirmed that pt was turned away and was declined an appointment.        Relevant past medical, surgical, family and social history reviewed and updated as indicated. Interim medical history since our last visit reviewed. Allergies and medications reviewed and updated.    Current Outpatient Medications:     acetaminophen (TYLENOL) 500 MG tablet, Take 1,000 mg by mouth every 6 (six) hours as needed for mild pain or moderate pain., Disp: , Rfl:    aspirin 325 MG tablet, Take 325 mg by mouth every other day., Disp: , Rfl:    chlorhexidine (PERIDEX) 0.12 % solution, Use as directed 15 mLs in the mouth or throat 2 (two) times daily., Disp: 120 mL, Rfl: 0   doxycycline (VIBRAMYCIN) 100 MG capsule, Take 1 capsule (100 mg total) by mouth 2 (two) times daily., Disp: 20 capsule, Rfl: 0   ibuprofen (ADVIL,MOTRIN) 600 MG tablet, Take 1 tablet (600 mg total) by mouth 4 (four) times daily., Disp: 30 tablet, Rfl: 0   lamoTRIgine (LAMICTAL) 100 MG tablet, Take 1 tablet (100 mg total) by mouth 2 (two) times daily., Disp: 180 tablet, Rfl: 3   lamoTRIgine (LAMICTAL) 25 MG tablet, Take 1 tablet (25 mg total) by mouth 2 (two) times daily., Disp: 60 tablet, Rfl: 5     Review of Systems  Per HPI unless specifically indicated above     Objective:    BP 118/70   Pulse 61  Temp (!) 96.1 F (35.6 C)   Ht 6\' 1"  (1.854 m)   Wt 172 lb (78 kg)   SpO2 97%   BMI 22.69 kg/m   Wt Readings from Last 3 Encounters:  09/17/21 172 lb (78 kg)  09/08/21 165 lb (74.8 kg)  07/17/21 172 lb 9.6 oz (78.3 kg)    Physical Exam Constitutional:      General: He is not in acute distress.    Appearance: He is not toxic-appearing.     Comments: Pt disheveled with foul body odor  HENT:     Head: Normocephalic and atraumatic.     Mouth/Throat:     Dentition: Abnormal dentition. Dental tenderness and dental caries present.     Comments: There is no swelling of the face.  Extensive decay as pictured.  No abscess seen. Skin:    General: Skin is warm and dry.  Neurological:     Mental Status: He is alert and oriented to person, place, and time.  Psychiatric:        Attention and Perception: Attention normal.        Mood and Affect: Mood is anxious.        Speech: Speech normal.        Behavior: Behavior is cooperative.                Assessment & Plan:    Encounter Diagnoses  Name Primary?   Dental decay Yes   Mental health problem    Tobacco use disorder        Dental Pt is to finish his antibiotics.  He is on dental list already.  Will try to get him moved up and seen sooner.  Panic Attacks Pt is given contact information for psychiatrist at [facility] clinic in [place].  Discussed with pt that he needs evaluation by [facility] specialist and he agrees.  -pt will follow up here in 6 months.  He is to contact office sooner prn

## 2021-09-17 NOTE — Patient Instructions (Addendum)
Psychiatrist- [facility]-  [phone]

## 2021-09-18 ENCOUNTER — Ambulatory Visit: Payer: Self-pay | Admitting: Cardiology

## 2021-09-25 ENCOUNTER — Ambulatory Visit: Payer: Self-pay | Admitting: Physician Assistant

## 2021-09-26 ENCOUNTER — Telehealth: Payer: Self-pay

## 2021-09-26 NOTE — Telephone Encounter (Signed)
Client called for transportation for dental follow up at Dr Maryjane Hurter office on 10/09/21 at 1130 am. [facility] transportation request sent by secure e-mail.  Francee Nodal RN [employer]

## 2021-11-06 ENCOUNTER — Telehealth: Payer: Self-pay

## 2021-11-06 NOTE — Telephone Encounter (Signed)
Client called audibly upset. He states that his situation is "going [place]". His power will be disconnected as of 12/04/21 without past due payments and he states he will also be losing his housing.  He has lost food stamps due to not working. He has been going to medical appointments. Transportation provided by [facility] for medical appointments as client does not have reliable transportation. Client is currently unemployed, but wants to work and is "trying to get my health issues under control so I can work". He reports he has an upcoming appointment to establish [place] services only at [facility] and hopes that along with treatment for his anxiety and depression will help him to be able to work and afford rent.  He is asking for help regarding bills and housing such as Income based housing. Discussed that will refer also to [employer] MSW intern and we will call him back tomorrow to discuss options. Client is aware that getting the amount needed for utilities is difficult as well as keeping payments up to date in future. Also this balance could affect future housing applications.   Client is agreeable for a call back tomorrow after lunch. Will continue to follow.  [employer]

## 2021-11-14 ENCOUNTER — Emergency Department (HOSPITAL_COMMUNITY): Payer: Self-pay

## 2021-11-14 ENCOUNTER — Emergency Department (HOSPITAL_COMMUNITY)
Admission: EM | Admit: 2021-11-14 | Discharge: 2021-11-14 | Disposition: A | Discharge status: Home / Self Care | Payer: Self-pay | Attending: Emergency Medicine | Admitting: Emergency Medicine

## 2021-11-14 ENCOUNTER — Encounter (HOSPITAL_COMMUNITY): Payer: Self-pay | Admitting: *Deleted

## 2021-11-14 ENCOUNTER — Telehealth: Payer: Self-pay | Admitting: Adult Health

## 2021-11-14 ENCOUNTER — Other Ambulatory Visit: Payer: Self-pay

## 2021-11-14 DIAGNOSIS — R4189 Other symptoms and signs involving cognitive functions and awareness: Secondary | ICD-10-CM

## 2021-11-14 DIAGNOSIS — G3184 Mild cognitive impairment, so stated: Secondary | ICD-10-CM | POA: Insufficient documentation

## 2021-11-14 DIAGNOSIS — Z7982 Long term (current) use of aspirin: Secondary | ICD-10-CM | POA: Insufficient documentation

## 2021-11-14 DIAGNOSIS — R4701 Aphasia: Secondary | ICD-10-CM | POA: Insufficient documentation

## 2021-11-14 LAB — CBC WITH DIFFERENTIAL/PLATELET
Abs Immature Granulocytes: 0.03 10*3/uL (ref 0.00–0.07)
Basophils Absolute: 0.1 10*3/uL (ref 0.0–0.1)
Basophils Relative: 1 %
Eosinophils Absolute: 0.1 10*3/uL (ref 0.0–0.5)
Eosinophils Relative: 1 %
HCT: 42.7 % (ref 39.0–52.0)
Hemoglobin: 14.1 g/dL (ref 13.0–17.0)
Immature Granulocytes: 0 %
Lymphocytes Relative: 26 %
Lymphs Abs: 2.7 10*3/uL (ref 0.7–4.0)
MCH: 30.6 pg (ref 26.0–34.0)
MCHC: 33 g/dL (ref 30.0–36.0)
MCV: 92.6 fL (ref 80.0–100.0)
Monocytes Absolute: 0.9 10*3/uL (ref 0.1–1.0)
Monocytes Relative: 8 %
Neutro Abs: 6.7 10*3/uL (ref 1.7–7.7)
Neutrophils Relative %: 64 %
Platelets: 337 10*3/uL (ref 150–400)
RBC: 4.61 MIL/uL (ref 4.22–5.81)
RDW: 13.2 % (ref 11.5–15.5)
WBC: 10.4 10*3/uL (ref 4.0–10.5)
nRBC: 0 % (ref 0.0–0.2)

## 2021-11-14 LAB — URINALYSIS, ROUTINE W REFLEX MICROSCOPIC
Bilirubin Urine: NEGATIVE
Glucose, UA: NEGATIVE mg/dL
Hgb urine dipstick: NEGATIVE
Ketones, ur: NEGATIVE mg/dL
Leukocytes,Ua: NEGATIVE
Nitrite: NEGATIVE
Protein, ur: NEGATIVE mg/dL
Specific Gravity, Urine: 1.003 — ABNORMAL LOW (ref 1.005–1.030)
pH: 7 (ref 5.0–8.0)

## 2021-11-14 LAB — RAPID URINE DRUG SCREEN, HOSP PERFORMED
Amphetamines: NOT DETECTED
Barbiturates: NOT DETECTED
Benzodiazepines: NOT DETECTED
Cocaine: NOT DETECTED
Opiates: NOT DETECTED
Tetrahydrocannabinol: POSITIVE — AB

## 2021-11-14 LAB — COMPREHENSIVE METABOLIC PANEL
ALT: 21 U/L (ref 0–44)
AST: 19 U/L (ref 15–41)
Albumin: 4.8 g/dL (ref 3.5–5.0)
Alkaline Phosphatase: 49 U/L (ref 38–126)
Anion gap: 11 (ref 5–15)
BUN: 9 mg/dL (ref 6–20)
CO2: 27 mmol/L (ref 22–32)
Calcium: 9.6 mg/dL (ref 8.9–10.3)
Chloride: 105 mmol/L (ref 98–111)
Creatinine, Ser: 0.77 mg/dL (ref 0.61–1.24)
GFR, Estimated: >60 mL/min (ref >60)
Glucose, Bld: 113 mg/dL — ABNORMAL HIGH (ref 70–99)
Potassium: 3.8 mmol/L (ref 3.5–5.1)
Sodium: 143 mmol/L (ref 135–145)
Total Bilirubin: 0.9 mg/dL (ref 0.3–1.2)
Total Protein: 7.7 g/dL (ref 6.5–8.1)

## 2021-11-14 LAB — ETHANOL: Alcohol, Ethyl (B): <10 mg/dL (ref <10)

## 2021-11-14 MED ORDER — GADOBUTROL 1 MMOL/ML IV SOLN
7.5000 mL | Freq: Once | INTRAVENOUS | Status: AC | PRN
  Administered 2021-11-14: 7.5 mL via INTRAVENOUS

## 2021-11-14 MED ORDER — LORAZEPAM 0.5 MG PO TABS: 0.5000 mg | ORAL_TABLET | Freq: Once | ORAL | Status: DC

## 2021-11-14 NOTE — ED Provider Notes (Signed)
[facility] EMERGENCY DEPARTMENT Provider Note   CSN: [id number] Arrival date & time: 11/14/21  1034     History  Chief Complaint  Patient presents with   Altered Mental Status    Trevor Pacheco is a 36 y.o. male.  HPI    36 year old male comes in with chief complaint of altered mental status. Patient indicates that he has had increased forgetfulness the last few days.  He also has noticed difficulty expressing himself lately.  He has had these issues now for several months, he neurology has seen him and had advised him that if his symptoms are getting worse, then he needs to call them.  Over the last 2 or 3 weeks, his symptoms have progressed therefore he had called neurology to see if they can get him in the clinic sooner.  Today patient was at the dentist, when he received a call from neurologist.  Patient was having difficulty expressing himself and perhaps had some slurred speech according to EMS, therefore 911 was called.  Patient denies any vision change, difficulty in swallowing.  He does think that his speech was slurred earlier today.  He denies any heavy alcohol use, tobacco use, substance use.  Home Medications Prior to Admission medications   Medication Sig Start Date End Date Taking? Authorizing Provider  acetaminophen (TYLENOL) 500 MG tablet Take 1,000 mg by mouth every 6 (six) hours as needed for mild pain or moderate pain.   Yes [provider]  cetirizine (ZYRTEC) 10 MG tablet Take 10 mg by mouth daily. 11/12/21  Yes [provider]  lamoTRIgine (LAMICTAL) 100 MG tablet Take 1 tablet (100 mg total) by mouth 2 (two) times daily. 06/13/21  Yes Marcial Pacas, MD  lamoTRIgine (LAMICTAL) 25 MG tablet Take 1 tablet (25 mg total) by mouth 2 (two) times daily. 07/17/21  Yes Ward Givens, NP  aspirin 325 MG tablet Take 325 mg by mouth every other day.    [provider]  chlorhexidine (PERIDEX) 0.12 % solution Use as directed 15 mLs in the mouth  or throat 2 (two) times daily. Patient not taking: Reported on 11/14/2021 09/08/21   Cristie Hem, MD  doxycycline (VIBRAMYCIN) 100 MG capsule Take 1 capsule (100 mg total) by mouth 2 (two) times daily. Patient not taking: Reported on 11/14/2021 09/08/21   Cristie Hem, MD  ibuprofen (ADVIL,MOTRIN) 600 MG tablet Take 1 tablet (600 mg total) by mouth 4 (four) times daily. Patient not taking: Reported on 11/14/2021 11/24/17   Lily Kocher, PA-C      Allergies    Clindamycin/lincomycin and Penicillins    Review of Systems   Review of Systems  All other systems reviewed and are negative.   Physical Exam Updated Vital Signs BP 119/71   Pulse 74   Temp 97.9 F (36.6 C) (Axillary)   Resp 16   Ht 6\' 1"  (1.854 m)   Wt 77.1 kg   SpO2 99%   BMI 22.43 kg/m  Physical Exam Vitals and nursing note reviewed.  Constitutional:      Appearance: He is well-developed.  HENT:     Head: Atraumatic.  Eyes:     Extraocular Movements: Extraocular movements intact.     Pupils: Pupils are equal, round, and reactive to light.  Cardiovascular:     Rate and Rhythm: Normal rate.  Pulmonary:     Effort: Pulmonary effort is normal.  Musculoskeletal:     Cervical back: Neck supple.  Skin:    General:  Skin is warm.  Neurological:     Mental Status: He is alert and oriented to person, place, and time.     Cranial Nerves: No cranial nerve deficit.     Sensory: No sensory deficit.     Motor: No weakness.     Coordination: Coordination normal.     Comments: No aphasia appreciated, but patient did have difficulty repeating long sentences after me     ED Results / Procedures / Treatments   Labs (all labs ordered are listed, but only abnormal results are displayed) Labs Reviewed  URINALYSIS, ROUTINE W REFLEX MICROSCOPIC - Abnormal; Notable for the following components:      Result Value   Color, Urine STRAW (*)    Specific Gravity, Urine 1.003 (*)    All other components within  normal limits  RAPID URINE DRUG SCREEN, HOSP PERFORMED - Abnormal; Notable for the following components:   Tetrahydrocannabinol POSITIVE (*)    All other components within normal limits  COMPREHENSIVE METABOLIC PANEL - Abnormal; Notable for the following components:   Glucose, Bld 113 (*)    All other components within normal limits  CBC WITH DIFFERENTIAL/PLATELET  ETHANOL  LAMOTRIGINE LEVEL    EKG EKG Interpretation  Date/Time:  Thursday November 14 2021 10:45:05 EDT Ventricular Rate:  76 PR Interval:  150 QRS Duration: 101 QT Interval:  369 QTC Calculation: 415 R Axis:   72 Text Interpretation: Sinus rhythm RSR' in V1 or V2, probably normal variant No acute changes No old tracing to compare Confirmed by Varney Biles [phone]) on 11/14/2021 12:48:36 PM  Radiology MR Brain W and Wo Contrast  Result Date: 11/14/2021 CLINICAL DATA:  Neuro deficit, acute, stroke suspected EXAM: MRI HEAD WITHOUT AND WITH CONTRAST TECHNIQUE: Multiplanar, multiecho pulse sequences of the brain and surrounding structures were obtained without and with intravenous contrast. CONTRAST:  7.54mL GADAVIST GADOBUTROL 1 MMOL/ML IV SOLN COMPARISON:  MRI head July 02, 2021. FINDINGS: Brain: No acute infarction, hemorrhage, hydrocephalus, extra-axial collection or mass lesion. Mega cisterna magna. No pathologic enhancement. Vascular: Major arterial voids are maintained at the skull base. Skull and upper cervical spine: Normal marrow signal. Sinuses/Orbits: Essentially clear sinuses. No acute orbital findings. Other: No mastoid effusions IMPRESSION: Normal brain MRI.  No acute abnormality. Electronically Signed   By: Margaretha Sheffield M.D.   On: 11/14/2021 14:12    Procedures Procedures    Medications Ordered in ED Medications  LORazepam (ATIVAN) tablet 0.5 mg (0 mg Oral Hold 11/14/21 1405)  gadobutrol (GADAVIST) 1 MMOL/ML injection 7.5 mL (7.5 mLs Intravenous Contrast Given 11/14/21 1407)    ED Course/ Medical  Decision Making/ A&P                           Medical Decision Making Amount and/or Complexity of Data Reviewed Labs: ordered. Radiology: ordered.  Risk Prescription drug management.   This patient presents to the ED with chief complaint(s) of slurred speech, worsening memory and difficulty expressing himself with pertinent past medical history of neurology evaluation in the outpatient setting for the same.The complaint involves an extensive differential diagnosis and also carries with it a high risk of complications and morbidity.    The differential diagnosis includes : Demyelinating disease, myelitis, stroke, severe electrolyte abnormality  The initial plan is to discuss the case with neurology.  It appears that patient's symptoms are not acute, they are not constant.  It appears that the symptoms have worsened over time. Unsure if  patient needs admission to the hospital and further advanced work-up including LP or discharge for outpatient work-up.   Additional history obtained: Records reviewed  previous neurology visits.  It appears that he had an EEG, MRI brain with and without contrast which were reassuring.  Independent labs interpretation:  The following labs were independently interpreted: CBC with differential, BMP are normal.  Talk screen is negative.  Independent visualization and interpretation of imaging: - I independently visualized the following imaging with scope of interpretation limited to determining acute life threatening conditions related to emergency care: MRI of the brain, which revealed no evidence of acute bleed. Per radiology note, no evidence of stroke, demyelinating disease like MS.  Treatment and Reassessment: Results discussed with the patient. Advised that he follows up with neurology as an outpatient sooner than his January appointment.  Consultation: - Consulted or discussed management/test interpretation with external professional: Spoke with  Dr. Malen Gauze, neurology.  He recommends getting MRI brain with and without contrast from the ER, if reassuring then patient can continue with outpatient work-up.  No need for patient to be admitted for his progressive cognitive issues.   Final Clinical Impression(s) / ED Diagnoses Final diagnoses:  Aphasia  Cognitive impairment    Rx / DC Orders ED Discharge Orders     None         Varney Biles, MD 11/14/21 1540

## 2021-11-14 NOTE — ED Triage Notes (Signed)
Pt brought in by [facility] from the dentist office with c/o feeling confused. EMS reports he has delayed responses. Pt was in the waiting room waiting to be seen and staff noticed pt seemed confused. Denies weakness.

## 2021-11-14 NOTE — ED Notes (Signed)
Pt returned from MRI °

## 2021-11-14 NOTE — Telephone Encounter (Signed)
Spoke to patient he was slurring his words Pt states he is not able to put sentences together . Pt states he wanted [place] to make [place] aware what is going on . Informed patient to call 911 to get evaluated at the ED  Pt states he was currently at the Dentist office Dr. Caren Griffins [place]. [place], in [place] I spoke to the receptionist there and I asked her how the patient looked receptionist stated not well he was shaky.Advised receptionist  to call 911 so the patient can go to ED to get evaluated. Receptionist said she will call 911 ASAP

## 2021-11-14 NOTE — Telephone Encounter (Signed)
Pt called needing to speak to the provider or RN regarding his medications and confusion. Pt sounded incoherent and slurring his words on occasion. Please advise.

## 2021-11-14 NOTE — Discharge Instructions (Signed)
You are seen in the ER for worsening memory impairment.  We discussed the case with our neurologist.  They recommended we get an MRI, which was completed and is reassuring.  Please follow-up with your neurologist for additional work-up that might be needed for your symptoms.

## 2021-11-14 NOTE — Telephone Encounter (Signed)
Called patient VM not set up Checked DPR no one on form to call.   Called 3x  Will call back later

## 2021-11-15 LAB — LAMOTRIGINE LEVEL: Lamotrigine Lvl: 8.2 ug/mL (ref 2.0–20.0)

## 2021-11-18 ENCOUNTER — Telehealth: Payer: Self-pay

## 2021-11-18 NOTE — Telephone Encounter (Signed)
Returned call to client regarding Midwife. Discussed with [facility] staff and home visit will be 11/22/21 at 11am by A. McCray. Mother will do notarized LOS, we already have taxes and ID. Client has no income.  Client with EMS called to his Dental appointment on 11/14/21 as he was slurring and having appearance of confusion. His Neurology office returned his earlier call from that morning and called 911. Client was evaluated in the [place].  He reports this has been going on for the last couple years off and on, but has gotten worse. He was scheduled to see his neurologist in January, but has now been rescheduled for 11/20/21 due to worsening symptoms. Client states he asked the ER to contact his provider that he had been seen on 11/14/21. Encouraged him to also follow up. He states his memory is getting worse.  Also, follow up regarding needing assistance with Utilities. Client's previous roommate had moved out and owed on the power bill in her name. He states today she had the power turned off. His mother gets some income and wants to put the bill in her name at that address. However, new information today is that mother owes from 2018 bill and that would need to be paid before establishing a new account and that would need to be paid to collections for [employer] first.  Amount owed is 453.00$ He states the plan is to keep power bill paid with his mom's income once the past due amount is paid off. Discussed that I am unsure of any resource to pay for a bill that old and to a collections agency and that amount. I will check for any possible private donations, but realistically not sure the entire amount would be paid and if that would be paid to a collections agency. I will check and will call client back tomorrow with any information.Marland Kitchen  He is agreeable.  [place] [employer]

## 2021-11-20 ENCOUNTER — Ambulatory Visit: Payer: Self-pay | Admitting: Adult Health

## 2021-11-22 ENCOUNTER — Telehealth: Payer: Self-pay

## 2021-11-22 NOTE — Telephone Encounter (Signed)
Client called stating his sister has information for me to check about any assistance with past power bill so that she can switch the utility to her name for their current address where client lives. She will text the information to me.  Client and his family currently without power. Only income in household is stated as his mother's income and that would be the plan moving forward to continue payments.  I reminded client that I will attempt to find assistance and will let him know once I know something. He states understanding and is agreeable. A. McCray is to do a home visit today to assist with CAFA so client can continue going to Neurologist.  Will continue to follow as needed.   [place] [employer]

## 2021-11-25 ENCOUNTER — Telehealth: Payer: Self-pay

## 2021-11-25 NOTE — Telephone Encounter (Signed)
Called client for follow up regarding power. He states that they were able to get the power turned back on thanks to the donation to help pay a past due amount.  Client now awaiting CAFA review so that he can reschedule with neurology. He states he is still having lots of memory loss.  No other needs today. Will continue to follow as needed.   Debria Garret RN Clara Gunn/[facility]

## 2021-11-26 ENCOUNTER — Ambulatory Visit: Payer: Self-pay | Admitting: Cardiology

## 2021-12-03 ENCOUNTER — Telehealth: Payer: Self-pay

## 2021-12-03 NOTE — Telephone Encounter (Signed)
Attempted call to check in on client recently had first [facility] appointment with Dr Register on 12/02/21. No answer, and VM not set up. Also texted client asking for him to call me at his convenience.  [place] [employer]

## 2021-12-24 ENCOUNTER — Encounter: Payer: Self-pay | Admitting: Physician Assistant

## 2021-12-30 ENCOUNTER — Other Ambulatory Visit: Payer: Self-pay | Admitting: Adult Health

## 2022-02-04 ENCOUNTER — Telehealth (INDEPENDENT_AMBULATORY_CARE_PROVIDER_SITE_OTHER): Payer: Self-pay | Admitting: Adult Health

## 2022-02-04 ENCOUNTER — Telehealth: Payer: Self-pay | Admitting: Adult Health

## 2022-02-04 DIAGNOSIS — M79609 Pain in unspecified limb: Secondary | ICD-10-CM

## 2022-02-04 DIAGNOSIS — R410 Disorientation, unspecified: Secondary | ICD-10-CM

## 2022-02-04 DIAGNOSIS — R202 Paresthesia of skin: Secondary | ICD-10-CM

## 2022-02-04 NOTE — Progress Notes (Signed)
PATIENT: Trevor Pacheco DOB: 1985/07/19  REASON FOR VISIT: follow up HISTORY FROM: patient  Virtual Visit via Video Note  I connected with Trevor Pacheco on 02/04/22 at  1:45 PM EST by a video enabled telemedicine application located remotely at [facility] and verified that I am speaking with the correct person using two identifiers who was located at their own home.   I discussed the limitations of evaluation and management by telemedicine and the availability of in person appointments. The patient expressed understanding and agreed to proceed.   PATIENT: Trevor Pacheco DOB: 05-Feb-1985  REASON FOR VISIT: follow up HISTORY FROM: patient  HISTORY OF PRESENT ILLNESS: Today 02/04/22: Trevor Pacheco is a 37 year old male with a history of confusional events, transient episodes of zoning out in pain on the left side of eye.  He returns today for follow-up.  He states that he is having Forgetfulness that is daily. Shaking in the right hand. Has pressure in the back of the head on the left side. In the last 3-4 months its gotten worse. Continues on Lamictal 125 mg twice a day. He does notice that the black outs and tremor in the head has stopped.  Went to see psychiatrist and was diagnosed was PTSD and was started on zoloft 150 mg a day.   Reports that in 2015 fell off the back of the truck but never sought care.  IMPRESSION: Normal brain MRI.  No acute abnormality.  HISTORY 07/17/21: Trevor Pacheco is a 37 year old male with a history of confusional events, transient episode of zoning out and pain on the left side of the body.  He returns today for follow-up.  He reports that the zoning out and confusional events have subsided since he started on Lamictal.  He states that if he gets emotional he will have pain that shoots down the left side of the body.  He states he will have numbness and tingling in the left arm.  He states that this also can occur mainly but never  consistently.  He does notice some improvement since starting Lamictal.  He does feel that he has some anxiety.  Reports that he has been in mental health in the past but was not started on any medication.  REVIEW OF SYSTEMS: Out of a complete 14 system review of symptoms, the patient complains only of the following symptoms, and all other reviewed systems are negative.  ALLERGIES: Allergies  Allergen Reactions   Clindamycin/Lincomycin Other (See Comments)    ?  Pt says it caused an infection in his throat)   Penicillins Nausea And Vomiting    HOME MEDICATIONS: Outpatient Medications Prior to Visit  Medication Sig Dispense Refill   acetaminophen (TYLENOL) 500 MG tablet Take 1,000 mg by mouth every 6 (six) hours as needed for mild pain or moderate pain.     aspirin 325 MG tablet Take 325 mg by mouth every other day.     cetirizine (ZYRTEC) 10 MG tablet Take 10 mg by mouth daily.     chlorhexidine (PERIDEX) 0.12 % solution Use as directed 15 mLs in the mouth or throat 2 (two) times daily. (Patient not taking: Reported on 11/14/2021) 120 mL 0   doxycycline (VIBRAMYCIN) 100 MG capsule Take 1 capsule (100 mg total) by mouth 2 (two) times daily. (Patient not taking: Reported on 11/14/2021) 20 capsule 0   ibuprofen (ADVIL,MOTRIN) 600 MG tablet Take 1 tablet (600 mg total) by mouth 4 (four) times daily. (  Patient not taking: Reported on 11/14/2021) 30 tablet 0   lamoTRIgine (LAMICTAL) 100 MG tablet Take 1 tablet (100 mg total) by mouth 2 (two) times daily. 180 tablet 3   lamoTRIgine (LAMICTAL) 25 MG tablet TAKE 1 Tablet  BY MOUTH TWICE DAILY (WITH 100mg  FOR total OF 125mg  TWICE DAILY) 60 tablet 5   No facility-administered medications prior to visit.    PAST MEDICAL HISTORY: Past Medical History:  Diagnosis Date   Heart murmur    Seizure (HCC)     PAST SURGICAL HISTORY: Past Surgical History:  Procedure Laterality Date   ABSCESS DRAINAGE     HAND SURGERY     LESION DESTRUCTION N/A  04/15/2021   Procedure: LESION DESTRUCTION PENIS;  Surgeon: ., MD;  Location: [facility];  Service: Urology;  Laterality: N/A;    FAMILY HISTORY: Family History  Problem Relation Age of Onset   Cirrhosis Father    Alcoholism Father    Seizures Father    Diabetes Maternal Aunt     SOCIAL HISTORY: Social History   Socioeconomic History   Marital status: Single    Spouse name: Not on file   Number of children: Not on file   Years of education: Not on file   Highest education level: Not on file  Occupational History   Not on file  Tobacco Use   Smoking status: Every Day    Packs/day: 0.25    Types: Cigarettes   Smokeless tobacco: Never  Vaping Use   Vaping Use: Never used  Substance and Sexual Activity   Alcohol use: Not Currently    Comment: hx heavy etoh.  none since age 32   Drug use: Yes    Types: Marijuana   Sexual activity: Not on file  Other Topics Concern   Not on file  Social History Narrative   Not on file   Social Determinants of Health   Financial Resource Strain: Not on file  Food Insecurity: Not on file  Transportation Needs: Unmet Transportation Needs (12/24/2021)   PRAPARE - 21 (Medical): Yes    Lack of Transportation (Non-Medical): Yes  Physical Activity: Not on file  Stress: Not on file  Social Connections: Not on file  Intimate Partner Violence: Not on file      PHYSICAL EXAM Generalized: Well developed, in no acute distress   Neurological examination  Mentation: Alert oriented to time, place, history taking. Follows all commands speech and language fluent Cranial nerve II-XII:Extraocular movements were full. Facial symmetry noted. uvula tongue midline. Head turning and shoulder shrug  were normal and symmetric. Motor: Good strength throughout subjectively per patient Sensory: Sensory testing is intact to soft touch on all 4 extremities subjectively per patient Coordination:  Cerebellar testing reveals good finger-nose-finger  Gait and station: Patient is able to stand from a seated position. gait is normal.  Reflexes: UTA  DIAGNOSTIC DATA (LABS, IMAGING, TESTING) - I reviewed patient records, labs, notes, testing and imaging myself where available.  Lab Results  Component Value Date   WBC 10.4 11/14/2021   HGB 14.1 11/14/2021   HCT 42.7 11/14/2021   MCV 92.6 11/14/2021   PLT 337 11/14/2021      Component Value Date/Time   NA 143 11/14/2021 1305   NA 141 06/13/2021 0821   K 3.8 11/14/2021 1305   CL 105 11/14/2021 1305   CO2 27 11/14/2021 1305   GLUCOSE 113 (H) 11/14/2021 1305   BUN 9 11/14/2021 1305  BUN 11 06/13/2021 0821   CREATININE 0.77 11/14/2021 1305   CALCIUM 9.6 11/14/2021 1305   PROT 7.7 11/14/2021 1305   PROT 7.6 06/13/2021 0821   ALBUMIN 4.8 11/14/2021 1305   ALBUMIN 4.8 06/13/2021 0821   AST 19 11/14/2021 1305   ALT 21 11/14/2021 1305   ALKPHOS 49 11/14/2021 1305   BILITOT 0.9 11/14/2021 1305   BILITOT 0.6 06/13/2021 0821   GFRNONAA >60 11/14/2021 1305   Lab Results  Component Value Date   CHOL 176 10/15/2020   HDL 30 (L) 10/15/2020   LDLCALC 121 (H) 10/15/2020   TRIG 127 10/15/2020   CHOLHDL 5.9 10/15/2020   Lab Results  Component Value Date   HGBA1C 4.7 (L) 10/15/2020   Lab Results  Component Value Date   VITAMINB12 254 06/13/2021   Lab Results  Component Value Date   TSH 1.660 06/13/2021      ASSESSMENT AND PLAN 37 y.o. year old male  has a past medical history of Heart murmur and Seizure (HCC). here with:  1.  Confusional event 2.  Episodes of zoning out 3.  Pain in the back of the head  Patient's symptoms of zoning out and tremors in the head have resolved with Lamictal.  However he is now having new symptoms of pain in the back of the head and worsening tremors in the right hand.  This was a video visit.  I explained the patient that I would need to see him in the office for full examination to workup  new symptoms.    Butch Penny, MSN, NP-C 02/04/2022, 1:37 PM [facility] [address] [place], [place] [address] [phone]

## 2022-02-24 ENCOUNTER — Ambulatory Visit: Payer: Self-pay | Admitting: Adult Health

## 2022-02-25 ENCOUNTER — Encounter: Payer: Self-pay | Admitting: Adult Health

## 2022-02-25 ENCOUNTER — Ambulatory Visit (INDEPENDENT_AMBULATORY_CARE_PROVIDER_SITE_OTHER): Payer: Self-pay | Admitting: Adult Health

## 2022-02-25 ENCOUNTER — Telehealth: Payer: Self-pay | Admitting: Adult Health

## 2022-02-25 VITALS — BP 125/78 | HR 77 | Ht 73.0 in | Wt 164.0 lb

## 2022-02-25 DIAGNOSIS — Z5181 Encounter for therapeutic drug level monitoring: Secondary | ICD-10-CM

## 2022-02-25 DIAGNOSIS — R413 Other amnesia: Secondary | ICD-10-CM

## 2022-02-25 DIAGNOSIS — R410 Disorientation, unspecified: Secondary | ICD-10-CM

## 2022-02-25 DIAGNOSIS — M542 Cervicalgia: Secondary | ICD-10-CM

## 2022-02-25 NOTE — Telephone Encounter (Signed)
self-pay sent to GI [phone]

## 2022-02-25 NOTE — Patient Instructions (Signed)
Your Plan:  Continue lamictal 125 mg twice a day Blood work today MRI of the cervical spine (neck) Speak to pyschiatrist about memory loss, forgetfulness If your symptoms worsen or you develop new symptoms please let us know.       Thank you for coming to see [place] at [facility]. I hope we have been able to provide you high quality care today.  You may receive a patient satisfaction survey over the next few weeks. We would appreciate your feedback and comments so that we may continue to improve ourselves and the health of our patients.
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
## 2022-02-26 LAB — CBC WITH DIFFERENTIAL/PLATELET
Basophils Absolute: 0.1 10*3/uL (ref 0.0–0.2)
Basos: 1 %
EOS (ABSOLUTE): 0.2 10*3/uL (ref 0.0–0.4)
Eos: 2 %
Hematocrit: 48.6 % (ref 37.5–51.0)
Hemoglobin: 16 g/dL (ref 13.0–17.7)
Immature Grans (Abs): 0 10*3/uL (ref 0.0–0.1)
Immature Granulocytes: 0 %
Lymphocytes Absolute: 2.2 10*3/uL (ref 0.7–3.1)
Lymphs: 19 %
MCH: 30.2 pg (ref 26.6–33.0)
MCHC: 32.9 g/dL (ref 31.5–35.7)
MCV: 92 fL (ref 79–97)
Monocytes Absolute: 0.9 10*3/uL (ref 0.1–0.9)
Monocytes: 8 %
Neutrophils Absolute: 7.9 10*3/uL — ABNORMAL HIGH (ref 1.4–7.0)
Neutrophils: 70 %
Platelets: 353 10*3/uL (ref 150–450)
RBC: 5.3 x10E6/uL (ref 4.14–5.80)
RDW: 12.4 % (ref 11.6–15.4)
WBC: 11.2 10*3/uL — ABNORMAL HIGH (ref 3.4–10.8)

## 2022-02-26 LAB — COMPREHENSIVE METABOLIC PANEL
ALT: 18 IU/L (ref 0–44)
AST: 18 IU/L (ref 0–40)
Albumin/Globulin Ratio: 1.6 (ref 1.2–2.2)
Albumin: 4.9 g/dL (ref 4.1–5.1)
Alkaline Phosphatase: 65 IU/L (ref 44–121)
BUN/Creatinine Ratio: 7 — ABNORMAL LOW (ref 9–20)
BUN: 6 mg/dL (ref 6–20)
Bilirubin Total: 0.6 mg/dL (ref 0.0–1.2)
CO2: 22 mmol/L (ref 20–29)
Calcium: 10.1 mg/dL (ref 8.7–10.2)
Chloride: 105 mmol/L (ref 96–106)
Creatinine, Ser: 0.84 mg/dL (ref 0.76–1.27)
Globulin, Total: 3 g/dL (ref 1.5–4.5)
Glucose: 91 mg/dL (ref 70–99)
Potassium: 4.7 mmol/L (ref 3.5–5.2)
Sodium: 144 mmol/L (ref 134–144)
Total Protein: 7.9 g/dL (ref 6.0–8.5)
eGFR: 117 mL/min/{1.73_m2} (ref >59)

## 2022-03-10 ENCOUNTER — Ambulatory Visit
Admission: RE | Admit: 2022-03-10 | Discharge: 2022-03-10 | Disposition: A | Discharge status: Home / Self Care | Payer: No Typology Code available for payment source | Source: Ambulatory Visit | Attending: Adult Health

## 2022-03-10 DIAGNOSIS — M542 Cervicalgia: Secondary | ICD-10-CM

## 2022-03-10 MED ORDER — GADOPICLENOL 0.5 MMOL/ML IV SOLN
7.0000 mL | Freq: Once | INTRAVENOUS | Status: AC | PRN
  Administered 2022-03-10: 7 mL via INTRAVENOUS

## 2022-03-12 ENCOUNTER — Telehealth: Payer: Self-pay

## 2022-03-12 NOTE — Telephone Encounter (Signed)
Client called requesting transportation to an upcoming appointment at [facility] on 03/19/22 at 10:15PM arrival time. Will schedule with [employer] and notify with client he is aware Betsy Pries will call to confirm the day prior to his appt.  He states he had an MRI on Monday for his neck and is awaiting review and consult from the MD. He states he has "crushed discs in my neck and a cyst" encouraged client to await review and explanation and plan of care from his provider.  No further needs at this time. [place] transportation request emailed to [employer] at [place] [facility]

## 2022-03-19 ENCOUNTER — Encounter: Payer: Self-pay | Admitting: Physician Assistant

## 2022-03-19 ENCOUNTER — Ambulatory Visit: Payer: No Typology Code available for payment source | Admitting: Physician Assistant

## 2022-03-19 VITALS — BP 138/82 | HR 62 | Temp 99.1°F | Ht 73.0 in | Wt 160.0 lb

## 2022-03-19 DIAGNOSIS — F17200 Nicotine dependence, unspecified, uncomplicated: Secondary | ICD-10-CM

## 2022-03-19 DIAGNOSIS — F489 Nonpsychotic mental disorder, unspecified: Secondary | ICD-10-CM

## 2022-03-19 DIAGNOSIS — M791 Myalgia, unspecified site: Secondary | ICD-10-CM

## 2022-03-19 MED ORDER — CYCLOBENZAPRINE HCL 10 MG PO TABS: 10.0000 mg | ORAL_TABLET | Freq: Three times a day (TID) | ORAL | 0 refills | Status: DC | PRN

## 2022-03-19 NOTE — Progress Notes (Signed)
BP 138/82   Pulse 62   Temp 99.1 F (37.3 C)   Ht 6' 1"$  (1.854 m)   Wt 160 lb (72.6 kg)   SpO2 98%   BMI 21.11 kg/m    Subjective:    Patient ID: Trevor Pacheco, male    DOB: 03-13-1985, 37 y.o.   MRN: [id number]  HPI: Trevor Pacheco is a 37 y.o. male presenting on 03/19/2022 for Follow-up and Muscle Pain (Pt states he feels pain in his muscles and feels like "it's torn". Pain is coming from L side of neck, down to his back, to around the L side of his chest. Pt states it fees like a charlie horse. Pt states it come to where he can barely do anything with his L arm from the pain)   HPI  Chief Complaint  Patient presents with   Follow-up   Muscle Pain    Pt states he feels pain in his muscles and feels like "it's torn". Pain is coming from L side of neck, down to his back, to around the L side of his chest. Pt states it fees like a charlie horse. Pt states it come to where he can barely do anything with his L arm from the pain     [facility] [place] [facility]  He continues with neurologist for confusion .  He has f/u in August.  He was evaluated for neck issue at his neurology appointment in January and MRI was ordered.  Results reviewed.  He is still living with his sister's friend.  He does not work.  Pt c/o tension down left side of neck and it goes down back and around to front in his chest.   He says this has been going since at least October but continues now.  He says it isn't his panic attacks because he doesn't have those any more.  He says it feels like a charley horse.  Pt says he applied for medicaid in December.  He says that [facility] is helping him look into that (he doesn't know if he was approved or if not why)    Relevant past medical, surgical, family and social history reviewed and updated as indicated. Interim medical history since our last visit reviewed. Allergies and medications reviewed and updated.    Current  Outpatient Medications:    diphenhydrAMINE HCl (BENADRYL ALLERGY PO), Take 1 tablet by mouth as needed., Disp: , Rfl:    lamoTRIgine (LAMICTAL) 100 MG tablet, Take 1 tablet (100 mg total) by mouth 2 (two) times daily., Disp: 180 tablet, Rfl: 3   lamoTRIgine (LAMICTAL) 25 MG tablet, TAKE 1 Tablet  BY MOUTH TWICE DAILY (WITH 172m FOR total OF 1214mTWICE DAILY), Disp: 60 tablet, Rfl: 5   sertraline (ZOLOFT) 100 MG tablet, Take 200 mg by mouth daily., Disp: , Rfl:    acetaminophen (TYLENOL) 500 MG tablet, Take 1,000 mg by mouth every 6 (six) hours as needed for mild pain or moderate pain. (Patient not taking: Reported on 03/19/2022), Disp: , Rfl:    aspirin 325 MG tablet, Take 325 mg by mouth every other day. (Patient not taking: Reported on 03/19/2022), Disp: , Rfl:    ibuprofen (ADVIL,MOTRIN) 600 MG tablet, Take 1 tablet (600 mg total) by mouth 4 (four) times daily. (Patient not taking: Reported on 03/19/2022), Disp: 30 tablet, Rfl: 0   Review of Systems  Per HPI unless specifically indicated above  Objective:    BP 138/82   Pulse 62   Temp 99.1 F (37.3 C)   Ht 6' 1"$  (1.854 m)   Wt 160 lb (72.6 kg)   SpO2 98%   BMI 21.11 kg/m   Wt Readings from Last 3 Encounters:  03/19/22 160 lb (72.6 kg)  02/25/22 164 lb (74.4 kg)  11/14/21 170 lb (77.1 kg)    Physical Exam Constitutional:      General: He is not in acute distress.    Appearance: He is not toxic-appearing.     Comments: Disheveled, very strong foul body odor.  HENT:     Head: Normocephalic and atraumatic.  Cardiovascular:     Rate and Rhythm: Normal rate and regular rhythm.  Pulmonary:     Effort: Pulmonary effort is normal. No respiratory distress.     Breath sounds: Normal breath sounds. No stridor. No wheezing or rhonchi.  Chest:     Chest wall: Tenderness present. No mass, deformity or swelling.  Musculoskeletal:     Cervical back: Tenderness present. No rigidity or bony tenderness.     Thoracic back: Tenderness  present. No bony tenderness.     Lumbar back: Tenderness present. No bony tenderness.     Right lower leg: No edema.     Left lower leg: No edema.     Comments: Diffuse tenderness B back, L>R, from neck down to lumbar region.  Tenderness continues around to left anterior chest wall.   There are no areas of point tenderness.  There is no bony tenderness  Skin:    General: Skin is warm and dry.  Neurological:     Mental Status: He is alert.  Psychiatric:        Attention and Perception: Attention normal.        Mood and Affect: Mood is anxious.        Behavior: Behavior is cooperative.            Assessment & Plan:    Encounter Diagnoses  Name Primary?   Muscle pain Yes   Tobacco use disorder    Mental health problem      -pt encouraged to soak in bathtub daily to help increase blood flow and relax the muscles both anteriorly and posteriorly.  He is given rx flexeril.  He is cautioned not to drive on this rx because it can cause sedation.  -pt is to continue with MH and neurology per their recommendation -pt to follow up here 6 months.  He is to RTO sooner if he fails to get relief of his symptoms

## 2022-03-19 NOTE — Patient Instructions (Addendum)
Soak in bathtub daily

## 2022-03-24 NOTE — Progress Notes (Signed)
Chart reviewed, agree above plan ?

## 2022-03-31 ENCOUNTER — Other Ambulatory Visit: Payer: Self-pay | Admitting: Physician Assistant

## 2022-04-14 DIAGNOSIS — F410 Panic disorder [episodic paroxysmal anxiety] without agoraphobia: Secondary | ICD-10-CM | POA: Diagnosis not present

## 2022-04-14 DIAGNOSIS — F4310 Post-traumatic stress disorder, unspecified: Secondary | ICD-10-CM | POA: Diagnosis not present

## 2022-04-14 DIAGNOSIS — F329 Major depressive disorder, single episode, unspecified: Secondary | ICD-10-CM | POA: Diagnosis not present

## 2022-04-15 ENCOUNTER — Telehealth: Payer: Self-pay | Admitting: Adult Health

## 2022-04-15 DIAGNOSIS — M542 Cervicalgia: Secondary | ICD-10-CM

## 2022-04-15 NOTE — Telephone Encounter (Signed)
Pt called stated he read his MRI report in mychart. Pt is asking what's the next step and would like a nurse to give him a call to go over MRI results.

## 2022-04-16 ENCOUNTER — Ambulatory Visit: Payer: No Typology Code available for payment source | Admitting: Physician Assistant

## 2022-04-16 NOTE — Telephone Encounter (Signed)
I do not think above MRI cervical finding explain his current symptoms  If he continue complains of neck pain, radiating pain to his occipital region, may consider physical therapy referral  If he still has recurrent confusion spells, refer him to prolonged video EEG monitoring by Dr. April Manson.  If he is doing well, taking current medications, will discharge him to PCP and psychiatrist

## 2022-04-16 NOTE — Addendum Note (Signed)
Addended by: Marcial Pacas on: 04/16/2022 11:50 AM   Modules accepted: Orders

## 2022-04-16 NOTE — Telephone Encounter (Signed)
Orders Placed This Encounter  Procedures  . Ambulatory referral to Physical Therapy   

## 2022-04-16 NOTE — Telephone Encounter (Signed)
Called pt and discussed message from Dr Krista Blue regarding MRI. He states he still has pain in his left arm and while he does have some strength, he favors his R arm because his left arm feels weak. He feels pressure in back & up to his neck. He also states that when he puts pressure on his back his "whole body flinches". He feels like around the middle of his back there is a piece of wood separating the top and bottom of his back. Mobility-wise he feels like his entire back is in two different sections. He states he had wanted to have an MRI of his back and would prefer that before trying physical therapy. He feels symptoms have gotten worse.   Regarding the confusion spells, he states he still has them. He says whenever he tries to focus on something his "mind goes blank". He states he's unsure if this is related to his back issues.

## 2022-04-23 ENCOUNTER — Other Ambulatory Visit: Payer: Self-pay

## 2022-04-23 ENCOUNTER — Ambulatory Visit: Payer: Medicaid Other | Attending: Neurology

## 2022-04-23 DIAGNOSIS — M542 Cervicalgia: Secondary | ICD-10-CM | POA: Diagnosis not present
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
## 2022-04-28 ENCOUNTER — Ambulatory Visit: Payer: Medicaid Other | Attending: Internal Medicine | Admitting: Internal Medicine

## 2022-04-28 ENCOUNTER — Encounter: Payer: Self-pay | Admitting: Internal Medicine

## 2022-04-28 ENCOUNTER — Other Ambulatory Visit: Payer: Self-pay | Admitting: Physician Assistant

## 2022-04-28 VITALS — BP 96/50 | HR 74 | Ht 73.0 in | Wt 171.0 lb

## 2022-04-28 DIAGNOSIS — R0789 Other chest pain: Secondary | ICD-10-CM

## 2022-04-28 NOTE — Patient Instructions (Signed)
Medication Instructions:  Your physician recommends that you continue on your current medications as directed. Please refer to the Current Medication list given to you today.   Labwork: None  Testing/Procedures: None  Follow-Up: Follow up with Dr. Mallipeddi as needed.   Any Other Special Instructions Will Be Listed Below (If Applicable).     If you need a refill on your cardiac medications before your next appointment, please call your pharmacy.  

## 2022-04-28 NOTE — Progress Notes (Signed)
Cardiology Office Note  Date: 04/28/2022   ID: Trevor Pacheco, DOB 1985-02-26, MRN [id number]  PCP:  No primary care provider on file.  Cardiologist:  Donato Heinz, MD Electrophysiologist:  None   Reason for Office Visit: Follow-up chest pain   History of Present Illness: Trevor Pacheco is a 37 y.o. male with no PMH presented to the cardiology clinic for follow-up of chest pain.  Patient was next referred to cardiology clinic for evaluation of chest pain. CT cardiac showed coronary calcium score of 0, normal origin of coronaries in 2023, echocardiogram showed low normal LVEF 50 to 55% and no valvular abnormalities in 2023, event monitor showed no atrial or ventricular arrhythmias, less than 1% ectopy burden in 2023.  Due to normal cardiology workup, he was referred to neurology clinic for further evaluation due to symptoms of confusion etc. He was diagnosed with seizure and was placed on Keppra but never had any visible seizures. Patient reported his symptoms improved after starting Keppra. Neurology then referred him to mental health who diagnosed him with severe PTSD and hence resultant symptoms. He presented today for follow-up visit, continues to have some chest pains but thinks it could be musculoskeletal. He is active at baseline and has no other symptoms of DOE, dizziness, lightheadedness, syncope.  Past Medical History:  Diagnosis Date   Heart murmur    Seizure     Past Surgical History:  Procedure Laterality Date   ABSCESS DRAINAGE     HAND SURGERY     LESION DESTRUCTION N/A 04/15/2021   Procedure: LESION DESTRUCTION PENIS;  Surgeon: Primus Bravo., MD;  Location: [facility];  Service: Urology;  Laterality: N/A;    Current Outpatient Medications  Medication Sig Dispense Refill   acetaminophen (TYLENOL) 500 MG tablet Take 1,000 mg by mouth every 6 (six) hours as needed for mild pain or moderate pain.     aspirin 325 MG tablet Take 325 mg by mouth every other  day.     cyclobenzaprine (FLEXERIL) 10 MG tablet TAKE 1 TABLET BY MOUTH 3 TIMES DAILY AS NEEDED FOR MUSCLE SPASMS 30 tablet 0   diphenhydrAMINE HCl (BENADRYL ALLERGY PO) Take 1 tablet by mouth as needed.     ibuprofen (ADVIL,MOTRIN) 600 MG tablet Take 1 tablet (600 mg total) by mouth 4 (four) times daily. 30 tablet 0   lamoTRIgine (LAMICTAL) 100 MG tablet Take 1 tablet (100 mg total) by mouth 2 (two) times daily. 180 tablet 3   lamoTRIgine (LAMICTAL) 25 MG tablet TAKE 1 Tablet  BY MOUTH TWICE DAILY (WITH 100mg  FOR total OF 125mg  TWICE DAILY) 60 tablet 5   prazosin (MINIPRESS) 1 MG capsule 1 capsule at bedtime for 1 week, then increase to 2 capsules (2 mg) at bedtime if needed for nightmares Orally Once a day for 30 days     sertraline (ZOLOFT) 100 MG tablet Take 200 mg by mouth daily.     No current facility-administered medications for this visit.   Allergies:  Clindamycin/lincomycin and Penicillins   Social History: The patient  reports that he has been smoking cigarettes. He has been smoking an average of .25 packs per day. He has never used smokeless tobacco. He reports that he does not currently use alcohol. He reports current drug use. Drug: Marijuana.   Family History: The patient's family history includes Alcoholism in his father; Cirrhosis in his father; Diabetes in his maternal aunt; Seizures in his father.   ROS:  Please see the history  of present illness. Otherwise, complete review of systems is positive for none.  All other systems are reviewed and negative.   Physical Exam: VS:  BP (!) 96/50   Pulse 74   Ht 6\' 1"  (1.854 m)   Wt 171 lb (77.6 kg)   SpO2 99%   BMI 22.56 kg/m , BMI Body mass index is 22.56 kg/m.  Wt Readings from Last 3 Encounters:  04/28/22 171 lb (77.6 kg)  03/19/22 160 lb (72.6 kg)  02/25/22 164 lb (74.4 kg)    General: Patient appears comfortable at rest. HEENT: Conjunctiva and lids normal, oropharynx clear with moist mucosa. Neck: Supple, no  elevated JVP or carotid bruits, no thyromegaly. Lungs: Clear to auscultation, nonlabored breathing at rest. Cardiac: Regular rate and rhythm, no S3 or significant systolic murmur, no pericardial rub. Abdomen: Soft, nontender, no hepatomegaly, bowel sounds present, no guarding or rebound. Extremities: No pitting edema, distal pulses 2+. Skin: Warm and dry. Musculoskeletal: No kyphosis. Neuropsychiatric: Alert and oriented x3, affect grossly appropriate.  ECG:  NSR  Recent Labwork: 06/13/2021: TSH 1.660 02/25/2022: ALT 18; AST 18; BUN 6; Creatinine, Ser 0.84; Hemoglobin 16.0; Platelets 353; Potassium 4.7; Sodium 144     Component Value Date/Time   CHOL 176 10/15/2020 1014   TRIG 127 10/15/2020 1014   HDL 30 (L) 10/15/2020 1014   CHOLHDL 5.9 10/15/2020 1014   VLDL 25 10/15/2020 1014   LDLCALC 121 (H) 10/15/2020 1014    Other Studies Reviewed Today: I personally reviewed the CT cardiac, echocardiogram and event monitor results from 2023  Assessment and Plan: Patient is a 37 year old M with no PMH presented to cardiology clinic for follow-up of chest pain.  # Noncardiac chest pain -CTA cardiac, echocardiogram and event monitor from 2023 was unremarkable.  Follow-up with neurology and mental health as scheduled.  I have spent a total of 20 minutes with patient reviewing chart, EKGs, labs and examining patient as well as establishing an assessment and plan that was discussed with the patient.  > 50% of time was spent in direct patient care.     Medication Adjustments/Labs and Tests Ordered: Current medicines are reviewed at length with the patient today.  Concerns regarding medicines are outlined above.   Tests Ordered: No orders of the defined types were placed in this encounter.   Medication Changes: No orders of the defined types were placed in this encounter.   Disposition:  Follow up prn  Signed, Jasiya Markie Fidel Levy, MD, 04/28/2022 9:19 AM    [facility] [address], [place], [place] [address]

## 2022-04-29 ENCOUNTER — Ambulatory Visit: Payer: Medicaid Other | Attending: Neurology

## 2022-04-29 DIAGNOSIS — M25512 Pain in left shoulder: Secondary | ICD-10-CM | POA: Diagnosis present

## 2022-04-29 DIAGNOSIS — M6281 Muscle weakness (generalized): Secondary | ICD-10-CM | POA: Diagnosis present

## 2022-04-29 DIAGNOSIS — M542 Cervicalgia: Secondary | ICD-10-CM | POA: Insufficient documentation

## 2022-04-29 DIAGNOSIS — G8929 Other chronic pain: Secondary | ICD-10-CM | POA: Insufficient documentation

## 2022-04-29 NOTE — Therapy (Signed)
OUTPATIENT PHYSICAL THERAPY CERVICAL TREATMENT   Patient Name: Trevor Pacheco MRN: [id number] DOB:May 27, 1985, 37 y.o., male Today's Date: 04/29/2022  END OF SESSION:  PT End of Session - 04/29/22 0948     Visit Number 2    Number of Visits 8    Date for PT Re-Evaluation 06/27/22    PT Start Time 0945    PT Stop Time [id number]   Requested to leave early due to pain.   PT Time Calculation (min) 32 min    Activity Tolerance Patient limited by pain    Behavior During Therapy WFL for tasks assessed/performed             Past Medical History:  Diagnosis Date   Heart murmur    Seizure    Past Surgical History:  Procedure Laterality Date   ABSCESS DRAINAGE     HAND SURGERY     LESION DESTRUCTION N/A 04/15/2021   Procedure: LESION DESTRUCTION PENIS;  Surgeon: Primus Bravo., MD;  Location: [facility];  Service: Urology;  Laterality: N/A;   Patient Active Problem List   Diagnosis Date Noted   Non-cardiac chest pain 04/28/2022   Confusion 06/13/2021   Penile lesion 04/02/2021   Submandibular abscess 09/23/2016   REFERRING PROVIDER: Marcial Pacas, MD   REFERRING DIAG: Cervicalgia   THERAPY DIAG:  Cervicalgia  Rationale for Evaluation and Treatment: Rehabilitation  ONSET DATE: 2015  SUBJECTIVE:                                                                                                                                                                                                         SUBJECTIVE STATEMENT: Patient reports that he is really hurting today, but it gets worse when he moves his arm. He notes that he has not been moving it due to the pain.  Hand dominance: Right  PERTINENT HISTORY:  Current smoker, PTSD, and history of seizures  PAIN:  Are you having pain? Yes: NPRS scale: 6/10 Pain location: neck, back, and left shoulder Pain description: sharp, constant  Aggravating factors: standing, using his left arm, and turning his head Relieving factors:  laying down and muscle relaxer's (ease pain some)  PRECAUTIONS: None  WEIGHT BEARING RESTRICTIONS: No  FALLS:  Has patient fallen in last 6 months? No  LIVING ENVIRONMENT: Lives with: lives alone Lives in: House/apartment  OCCUPATION: unable to work  PLOF: Independent  PATIENT GOALS: reduced pain, use his left arm more, and be able to do regular activities  NEXT MD VISIT: 09/16/22  OBJECTIVE:   DIAGNOSTIC FINDINGS:  03/10/22 cervical MRI  IMPRESSION: This MRI of the cervical spine with and without contrast shows the following: The spinal cord appears normal. 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 Normal enhancement pattern.  COGNITION: Overall cognitive status: Within functional limits for tasks assessed  SENSATION: Patient reports no numbness or tingling  POSTURE: rounded shoulders, forward head, and flexed trunk   PALPATION: TTP: left cervical and thoracic paraspinals, upper trapezius, levator scapulae, latissimus dorsi, deltoid, and infraspinatus   CERVICAL ROM:   Active ROM A/PROM (deg) eval  Flexion 36  Extension 18; with tremor at end range and "tight"  Right lateral flexion 44  Left lateral flexion 32; with tremor at end range   Right rotation 52  Left rotation 54; with tremor at end range    (Blank rows = not tested)  UPPER EXTREMITY ROM:  Active ROM Right eval Left eval  Shoulder flexion 170 140  Shoulder extension    Shoulder abduction 150 118  Shoulder adduction    Shoulder extension    Shoulder internal rotation    Shoulder external rotation    Elbow flexion    Elbow extension    Wrist flexion    Wrist extension    Wrist ulnar deviation    Wrist radial deviation    Wrist pronation    Wrist supination     (Blank rows = not tested)  UPPER EXTREMITY MMT:  MMT Right eval Left eval  Shoulder flexion  4/5 3+/5  Shoulder extension    Shoulder abduction 4+/5 4-/5  Shoulder adduction    Shoulder extension    Shoulder internal rotation    Shoulder external rotation    Middle trapezius    Lower trapezius    Elbow flexion 4/5 4-/5  Elbow extension 4/5 3+/5  Wrist flexion    Wrist extension    Wrist ulnar deviation    Wrist radial deviation    Wrist pronation    Wrist supination    Grip strength 85 70   (Blank rows = not tested)  CERVICAL SPECIAL TESTS:  Spurling's test: Negative, Distraction test: Positive, and Sharp pursor's test: Negative  TODAY'S TREATMENT:                                                                                                                              DATE:                                     4/2 EXERCISE LOG  Exercise Repetitions and Resistance Comments  Pulleys 2 minutes   Scapular retraction  10 reps    Chin tucks  10 reps             Blank cell = exercise not performed today  Modalities: no  redness or adverse reaction to today's modalities  Date:  Unattended Estim: left upper trapezius and scapular stabilizers, IFC @ 80-150 Hz w/ 40% scan, 13 mins, Pain and Tone  PATIENT EDUCATION:  Education details: Printmaker Person educated: Patient Education method: Explanation Education comprehension: verbalized understanding  HOME EXERCISE PROGRAM:   ASSESSMENT:  CLINICAL IMPRESSION: Patient was attempted to be introduced to multiple new active interventions. However, he was significantly limited due to his familiar cervical and left shoulder pain. Electrical stimulation was able to temporarily reduce his familiar symptoms. However, he reported that his symptoms returned quickly following the removal of this modality. He requested to leave early due to his high pain severity. He continues to require skilled physical therapy to address his remaining impairments to maximize his functional mobility.   OBJECTIVE IMPAIRMENTS:  decreased activity tolerance, decreased ROM, decreased strength, hypomobility, increased muscle spasms, impaired tone, impaired UE functional use, postural dysfunction, and pain.   ACTIVITY LIMITATIONS: carrying, lifting, reach over head, and hygiene/grooming  PARTICIPATION LIMITATIONS: community activity  PERSONAL FACTORS: Behavior pattern, Time since onset of injury/illness/exacerbation, and 3+ comorbidities: Current smoker, PTSD, and history of seizures  are also affecting patient's functional outcome.   REHAB POTENTIAL: Fair    CLINICAL DECISION MAKING: Evolving/moderate complexity  EVALUATION COMPLEXITY: Moderate   GOALS: Goals reviewed with patient? Yes  LONG TERM GOALS: Target date: 05/21/22  Patient will be independent with his HEP. Baseline: No HEP as of initial evaluation Goal status: INITIAL  2.  Patient will be able to demonstrate at least 60 degrees of cervical rotation bilaterally. Baseline: See objective Goal status: INITIAL  3.  Patient will be able to complete his daily activities without his familiar pain exceeding 5/10. Baseline: 7/10 Goal status: INITIAL  4.  Patient will be able to demonstrate improved left upper extremity strength to at least 4/5 for improved function with daily activities. Baseline: See objective Goal status: INITIAL  PLAN:  PT FREQUENCY: 2x/week  PT DURATION: 4 weeks  PLANNED INTERVENTIONS: Therapeutic exercises, Therapeutic activity, Neuromuscular re-education, Patient/Family education, Self Care, Joint mobilization, Electrical stimulation, Spinal mobilization, Cryotherapy, Moist heat, Traction, Ultrasound, Manual therapy, and Re-evaluation  PLAN FOR NEXT SESSION: Chin tucks, active assisted cervical range of motion, pulleys, upper extremity strengthening, and modalities as needed   Darlin Coco, PT 04/29/2022, 11:09 AM

## 2022-05-01 ENCOUNTER — Ambulatory Visit: Payer: Medicaid Other

## 2022-05-01 DIAGNOSIS — M542 Cervicalgia: Secondary | ICD-10-CM | POA: Diagnosis not present

## 2022-05-01 NOTE — Therapy (Signed)
OUTPATIENT PHYSICAL THERAPY CERVICAL TREATMENT   Patient Name: Trevor Pacheco MRN: [id number] DOB:[birth year], 37 y.o., male Today's Date: 05/01/2022  END OF SESSION:  PT End of Session - 05/01/22 1018     Visit Number 3    Number of Visits 8    Date for PT Re-Evaluation 06/27/22    PT Start Time 1020    PT Stop Time 1048    PT Time Calculation (min) 28 min    Activity Tolerance Patient limited by pain    Behavior During Therapy [facility] for tasks assessed/performed              Past Medical History:  Diagnosis Date   Heart murmur    Seizure    Past Surgical History:  Procedure Laterality Date   ABSCESS DRAINAGE     HAND SURGERY     LESION DESTRUCTION N/A 04/15/2021   Procedure: LESION DESTRUCTION PENIS;  Surgeon: Primus Bravo., MD;  Location: [facility];  Service: Urology;  Laterality: N/A;   Patient Active Problem List   Diagnosis Date Noted   Non-cardiac chest pain 04/28/2022   Confusion 06/13/2021   Penile lesion 04/02/2021   Submandibular abscess 09/23/2016   REFERRING PROVIDER: Marcial Pacas, MD   REFERRING DIAG: Cervicalgia   THERAPY DIAG:  Cervicalgia  Rationale for Evaluation and Treatment: Rehabilitation  ONSET DATE: 2015  SUBJECTIVE:                                                                                                                                                                                                         SUBJECTIVE STATEMENT: Patient reports that his shoulder still feels tight today. He feels like he has been tense since his last appointment. He feels that the "shock therapy (electrical stimulation) set of some old nerves" and he felt tingling for the rest of that day.  Hand dominance: Right  PERTINENT HISTORY:  Current smoker, PTSD, and history of seizures  PAIN:  Are you having pain? Yes: NPRS scale: 5-6/10 Pain location: neck, back, and left shoulder Pain description: sharp, constant  Aggravating factors: standing,  using his left arm, and turning his head Relieving factors: laying down and muscle relaxer's (ease pain some)  PRECAUTIONS: None  WEIGHT BEARING RESTRICTIONS: No  FALLS:  Has patient fallen in last 6 months? No  LIVING ENVIRONMENT: Lives with: lives alone Lives in: House/apartment  OCCUPATION: unable to work  PLOF: Independent  PATIENT GOALS: reduced pain, use his left arm more, and be able to do regular activities  NEXT MD VISIT: 09/16/22  OBJECTIVE:   DIAGNOSTIC FINDINGS: 03/10/22 cervical MRI  IMPRESSION: This MRI of the cervical spine with and without contrast shows the following: The spinal cord appears normal. 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 Normal enhancement pattern.  COGNITION: Overall cognitive status: Within functional limits for tasks assessed  SENSATION: Patient reports no numbness or tingling  POSTURE: rounded shoulders, forward head, and flexed trunk   PALPATION: TTP: left cervical and thoracic paraspinals, upper trapezius, levator scapulae, latissimus dorsi, deltoid, and infraspinatus   CERVICAL ROM:   Active ROM A/PROM (deg) eval  Flexion 36  Extension 18; with tremor at end range and "tight"  Right lateral flexion 44  Left lateral flexion 32; with tremor at end range   Right rotation 52  Left rotation 54; with tremor at end range    (Blank rows = not tested)  UPPER EXTREMITY ROM:  Active ROM Right eval Left eval  Shoulder flexion 170 140  Shoulder extension    Shoulder abduction 150 118  Shoulder adduction    Shoulder extension    Shoulder internal rotation    Shoulder external rotation    Elbow flexion    Elbow extension    Wrist flexion    Wrist extension    Wrist ulnar deviation    Wrist radial deviation    Wrist pronation    Wrist supination     (Blank rows = not tested)  UPPER  EXTREMITY MMT:  MMT Right eval Left eval  Shoulder flexion 4/5 3+/5  Shoulder extension    Shoulder abduction 4+/5 4-/5  Shoulder adduction    Shoulder extension    Shoulder internal rotation    Shoulder external rotation    Middle trapezius    Lower trapezius    Elbow flexion 4/5 4-/5  Elbow extension 4/5 3+/5  Wrist flexion    Wrist extension    Wrist ulnar deviation    Wrist radial deviation    Wrist pronation    Wrist supination    Grip strength 85 70   (Blank rows = not tested)  CERVICAL SPECIAL TESTS:  Spurling's test: Negative, Distraction test: Positive, and Sharp pursor's test: Negative  TODAY'S TREATMENT:                                                                                                                              DATE:                                     4/4 EXERCISE LOG  Exercise Repetitions and Resistance Comments  Ball roll out  2 minutes Small arc of motion   Isometric ball press  20 reps   Isometric shoulder extension 20 reps    Supine cervical rotation  7 reps each  Bilateral ER  15 reps  No resistance  Isometric IR 25 reps  Ball squeeze  Resisted scapular circles Yellow t-band x 20 reps each     Blank cell = exercise not performed today                                    4/2 EXERCISE LOG  Exercise Repetitions and Resistance Comments  Pulleys 2 minutes   Scapular retraction  10 reps    Chin tucks  10 reps             Blank cell = exercise not performed today  Modalities: no redness or adverse reaction to today's modalities  Date:  Unattended Estim: left upper trapezius and scapular stabilizers, IFC @ 80-150 Hz w/ 40% scan, 13 mins, Pain and Tone  PATIENT EDUCATION:  Education details: Printmaker Person educated: Patient Education method: Explanation Education comprehension: verbalized understanding  HOME EXERCISE PROGRAM:   ASSESSMENT:  CLINICAL IMPRESSION: Patient was introduced to multiple new  interventions for improved cervical and scapular mobility.  He required moderate cueing with today's new interventions for proper biomechanics and force production to avoid a significant increase in his familiar symptoms.  Pain was his primary limitation with today's interventions.  He was unable to complete a full treatment session due to pain and "it will not help."  He may benefit from additional medical intervention due to high pain severity and irritability which limited his ability to participate in physical therapy.  OBJECTIVE IMPAIRMENTS: decreased activity tolerance, decreased ROM, decreased strength, hypomobility, increased muscle spasms, impaired tone, impaired UE functional use, postural dysfunction, and pain.   ACTIVITY LIMITATIONS: carrying, lifting, reach over head, and hygiene/grooming  PARTICIPATION LIMITATIONS: community activity  PERSONAL FACTORS: Behavior pattern, Time since onset of injury/illness/exacerbation, and 3+ comorbidities: Current smoker, PTSD, and history of seizures  are also affecting patient's functional outcome.   REHAB POTENTIAL: Fair    CLINICAL DECISION MAKING: Evolving/moderate complexity  EVALUATION COMPLEXITY: Moderate   GOALS: Goals reviewed with patient? Yes  LONG TERM GOALS: Target date: 05/21/22  Patient will be independent with his HEP. Baseline: No HEP as of initial evaluation Goal status: INITIAL  2.  Patient will be able to demonstrate at least 60 degrees of cervical rotation bilaterally. Baseline: See objective Goal status: INITIAL  3.  Patient will be able to complete his daily activities without his familiar pain exceeding 5/10. Baseline: 7/10 Goal status: INITIAL  4.  Patient will be able to demonstrate improved left upper extremity strength to at least 4/5 for improved function with daily activities. Baseline: See objective Goal status: INITIAL  PLAN:  PT FREQUENCY: 2x/week  PT DURATION: 4 weeks  PLANNED INTERVENTIONS:  Therapeutic exercises, Therapeutic activity, Neuromuscular re-education, Patient/Family education, Self Care, Joint mobilization, Electrical stimulation, Spinal mobilization, Cryotherapy, Moist heat, Traction, Ultrasound, Manual therapy, and Re-evaluation  PLAN FOR NEXT SESSION: Chin tucks, active assisted cervical range of motion, pulleys, upper extremity strengthening, and modalities as needed   Darlin Coco, PT 05/01/2022, 12:54 PM

## 2022-05-06 ENCOUNTER — Other Ambulatory Visit: Payer: Self-pay | Admitting: Neurology

## 2022-05-06 ENCOUNTER — Ambulatory Visit: Payer: Medicaid Other | Admitting: Physical Therapy

## 2022-05-06 NOTE — Therapy (Signed)
Patient arrived today stating PT is not helping and therefore not wanting to be treated today. He states he has a PCP visit next week.  Informed him we will put his chart on hold at this time and he can call the clinic should he like another treatment.  [place] Dody Smartt MPT

## 2022-05-08 ENCOUNTER — Ambulatory Visit: Payer: Medicaid Other | Admitting: *Deleted

## 2022-05-14 ENCOUNTER — Encounter: Payer: Self-pay | Admitting: Family Medicine

## 2022-05-14 ENCOUNTER — Ambulatory Visit (INDEPENDENT_AMBULATORY_CARE_PROVIDER_SITE_OTHER): Payer: Medicaid Other | Admitting: Family Medicine

## 2022-05-14 VITALS — BP 106/76 | HR 75 | Temp 98.1°F | Ht 73.0 in | Wt 163.0 lb

## 2022-05-14 DIAGNOSIS — R251 Tremor, unspecified: Secondary | ICD-10-CM | POA: Diagnosis not present

## 2022-05-14 DIAGNOSIS — R413 Other amnesia: Secondary | ICD-10-CM

## 2022-05-14 DIAGNOSIS — F4310 Post-traumatic stress disorder, unspecified: Secondary | ICD-10-CM

## 2022-05-14 DIAGNOSIS — Z1331 Encounter for screening for depression: Secondary | ICD-10-CM

## 2022-05-14 DIAGNOSIS — M542 Cervicalgia: Secondary | ICD-10-CM | POA: Diagnosis not present

## 2022-05-14 NOTE — Progress Notes (Signed)
New Patient Office Visit  Subjective   Patient ID: Trevor Pacheco, male    DOB: Dec 22, 1985  Age: 37 y.o. MRN: [id number]  CC:  Chief Complaint  Patient presents with   New Patient (Initial Visit)    Back and spine pain   HPI Trevor Pacheco presents to establish care. He was previously seen at the [facility] in [place].  States that his main concern today is left arm pain that starts at his neck and radiates down.  He has been seen by Neurology and Psychiatry for this same issue. In addition, he has completed some sessions of PT and they "restarted" a lot of his pain with the TENS unit. He reports that his pain all started with his injury in 2015.  He states that he has gotten more stiff since 2015.  He reports that he tries to not use his arm as much because of the pain. He endorses pain on pronation and abduction of his left arm. The pain presents in his middle back and upper neck. Reports that PT made it worse and more tight. He describes it as a tension and that the muscle "locks up." He states states that he tries to keep his body lose and move his muscles, but that he could not complete the work at [employer]. He has full ROM with rotation of his shoulder joint  Outpatient Encounter Medications as of 05/14/2022  Medication Sig   acetaminophen (TYLENOL) 500 MG tablet Take 1,000 mg by mouth every 6 (six) hours as needed for mild pain or moderate pain.   aspirin 325 MG tablet Take 325 mg by mouth every other day.   cyclobenzaprine (FLEXERIL) 10 MG tablet TAKE 1 TABLET BY MOUTH 3 TIMES DAILY AS NEEDED FOR MUSCLE SPASMS   diphenhydrAMINE (BENADRYL ALLERGY) 25 mg capsule 1 capsule at bedtime as needed Orally Once a day   ibuprofen (ADVIL,MOTRIN) 600 MG tablet Take 1 tablet (600 mg total) by mouth 4 (four) times daily.   lamoTRIgine (LAMICTAL) 100 MG tablet TAKE 1 Tablet  BY MOUTH TWICE DAILY   lamoTRIgine (LAMICTAL) 25 MG tablet TAKE 1 Tablet  BY MOUTH TWICE DAILY (WITH  FOR total OF   TWICE DAILY)   prazosin (MINIPRESS) 1 MG capsule 1 capsule at bedtime for 1 week, then increase to 2 capsules (2 mg) at bedtime if needed for nightmares Orally Once a day for 30 days   sertraline (ZOLOFT) 100 MG tablet Take 200 mg by mouth daily.   [DISCONTINUED] diphenhydrAMINE HCl (BENADRYL ALLERGY PO) Take 1 tablet by mouth as needed.   No facility-administered encounter medications on file as of 05/14/2022.    Past Medical History:  Diagnosis Date   Heart murmur    Seizure     Past Surgical History:  Procedure Laterality Date   ABSCESS DRAINAGE     HAND SURGERY     LESION DESTRUCTION N/A 04/15/2021   Procedure: LESION DESTRUCTION PENIS;  Surgeon: Milderd Meager., MD;  Location: [facility];  Service: Urology;  Laterality: N/A;    Family History  Problem Relation Age of Onset   Cirrhosis Father    Alcoholism Father    Seizures Father    Diabetes Maternal Aunt    Alzheimer's disease Neg Hx     Social History   Socioeconomic History   Marital status: Single    Spouse name: Not on file   Number of children: Not on file   Years of education: Not on file  Highest education level: Not on file  Occupational History   Not on file  Tobacco Use   Smoking status: Every Day    Packs/day: .25    Types: Cigarettes   Smokeless tobacco: Never  Vaping Use   Vaping Use: Never used  Substance and Sexual Activity   Alcohol use: Not Currently    Comment: hx heavy etoh.  none since age 36   Drug use: Yes    Types: Marijuana   Sexual activity: Not on file  Other Topics Concern   Not on file  Social History Narrative   Not on file   Social Determinants of Health   Financial Resource Strain: Not on file  Food Insecurity: Not on file  Transportation Needs: Unmet Transportation Needs (12/24/2021)   PRAPARE - Administrator, Civil Service (Medical): Yes    Lack of Transportation (Non-Medical): Yes  Physical Activity: Not on file  Stress: Not on file   Social Connections: Not on file  Intimate Partner Violence: Not on file   ROS As per HPI  Objective   BP 106/76   Pulse 75   Temp 98.1 F (36.7 C)   Ht  (1.854 m)   Wt 163 lb (73.9 kg)   SpO2 97%   BMI 21.51 kg/m   Physical Exam Constitutional:      General: He is awake. He is not in acute distress.    Appearance: Normal appearance. He is normal weight. He is not ill-appearing, toxic-appearing or diaphoretic.     Comments: Not well-groomed  Cardiovascular:     Rate and Rhythm: Normal rate.     Pulses: Normal pulses.     Heart sounds: Normal heart sounds. No murmur heard.    No gallop.  Pulmonary:     Effort: Pulmonary effort is normal. No respiratory distress.     Breath sounds: Normal breath sounds. No stridor. No wheezing, rhonchi or rales.  Musculoskeletal:     Right shoulder: No swelling, deformity, effusion, laceration, tenderness, bony tenderness or crepitus. Normal range of motion. Normal strength. Normal pulse.     Left shoulder: Crepitus present. No swelling, deformity, effusion, laceration, tenderness or bony tenderness. Decreased range of motion. Normal strength. Normal pulse.     Cervical back: Spasms present. No swelling, edema, deformity, erythema, torticollis, bony tenderness or crepitus. Pain with movement present. Decreased range of motion.     Thoracic back: Spasms present. No swelling, edema, deformity, signs of trauma, lacerations, tenderness or bony tenderness. Decreased range of motion.     Lumbar back: Normal.     Comments: Crepitus present in LEFT shoulder during abduction and adduction. Full ROM of spine. Decreased extension of neck.   Skin:    General: Skin is warm.     Capillary Refill: Capillary refill takes less than 2 seconds.  Neurological:     General: No focal deficit present.     Mental Status: He is alert and oriented to person, place, and time. Mental status is at baseline.     Motor: No weakness.  Psychiatric:        Mood and  Affect: Mood normal.        Speech: Speech is delayed.        Behavior: Behavior is slowed. Behavior is cooperative.        Thought Content: Thought content normal.        Cognition and Memory: He exhibits impaired remote memory.        Judgment:  Judgment normal.       05/14/2022   10:35 AM  Depression screen PHQ 2/9  Decreased Interest 2  Down, Depressed, Hopeless 2  PHQ - 2 Score 4  Altered sleeping 1  Tired, decreased energy 1  Change in appetite 1  Feeling bad or failure about yourself  3  Trouble concentrating 3  Moving slowly or fidgety/restless 3  Suicidal thoughts 0  PHQ-9 Score 16  Difficult doing work/chores Extremely dIfficult      05/14/2022   10:36 AM  GAD 7 : Generalized Anxiety Score  Nervous, Anxious, on Edge 0  Control/stop worrying 0  Worry too much - different things 3  Trouble relaxing 2  Restless 1  Easily annoyed or irritable 2  Afraid - awful might happen 0  Total GAD 7 Score 8   Assessment & Plan:  1. Cervicalgia 2. Impaired memory 3. Tremor of right hand Instructed patient to complete Physical therapy as recommended by neurology. Reiterated the importance of PT. He is established with psychiatry and with neurology to follow up on symptoms. Offered to patient prednisone burst to help with symptoms at this time, patient decline. Offered to patient referral to pain management, patient decline. Patient would like additional imaging of his spine. Will contact Neurology to discuss patient concerns and requests.   4. Encounter for screening for depression 5. PTSD (post-traumatic stress disorder) Patient denies SI on screening. He is established with psychiatry for management of his PTSD. We are unable to access their records at this time, but patient completed a records request form for [place] today in office. We will refer back to psych for management of Depression/Anxiety and PTSD.   The above assessment and management plan was discussed with the patient.  The patient verbalized understanding of and has agreed to the management plan using shared-decision making. Patient is aware to call the clinic if they develop any new symptoms or if symptoms fail to improve or worsen. Patient is aware when to return to the clinic for a follow-up visit. Patient educated on when it is appropriate to go to the emergency department.   Neale Burly, DNP-FNP [facility] [address] [place], [place] [address] [phone]

## 2022-05-16 ENCOUNTER — Telehealth: Payer: Self-pay | Admitting: Family Medicine

## 2022-05-16 NOTE — Telephone Encounter (Signed)
Pt called stating that he needs PCP to write him out of work for 1-2 months until he can take care of his issues that PCP is aware of, that way he can afford to eat and have time to do what he needs to do to get better.   Please advise and call patient.

## 2022-05-19 NOTE — Telephone Encounter (Signed)
Pt has been informed of Gabrielle's recommendations.   He does not agree because neurology said to discuss with [place]. Informed pt that they are addressing the concern so they will need to provide note.  Pt also states that is seeing PT and may call them for note as well.

## 2022-05-21 ENCOUNTER — Ambulatory Visit: Payer: Medicaid Other

## 2022-05-21 DIAGNOSIS — M542 Cervicalgia: Secondary | ICD-10-CM | POA: Diagnosis not present

## 2022-05-21 DIAGNOSIS — G8929 Other chronic pain: Secondary | ICD-10-CM

## 2022-05-21 DIAGNOSIS — M6281 Muscle weakness (generalized): Secondary | ICD-10-CM

## 2022-05-21 NOTE — Therapy (Signed)
OUTPATIENT PHYSICAL THERAPY CERVICAL TREATMENT   Patient Name: Trevor Pacheco MRN: [id number] DOB:1985/07/22, 37 y.o., male Today's Date: 05/21/2022  END OF SESSION:  PT End of Session - 05/21/22 1437     Visit Number 4    Number of Visits 8    Date for PT Re-Evaluation 06/27/22    PT Start Time 1345    PT Stop Time 1403    PT Time Calculation (min) 18 min    Activity Tolerance Patient limited by pain    Behavior During Therapy [facility] for tasks assessed/performed              Past Medical History:  Diagnosis Date   Heart murmur    Seizure    Past Surgical History:  Procedure Laterality Date   ABSCESS DRAINAGE     HAND SURGERY     LESION DESTRUCTION N/A 04/15/2021   Procedure: LESION DESTRUCTION PENIS;  Surgeon: Milderd Meager., MD;  Location: [facility];  Service: Urology;  Laterality: N/A;   Patient Active Problem List   Diagnosis Date Noted   Non-cardiac chest pain 04/28/2022   Confusion 06/13/2021   Penile lesion 04/02/2021   Submandibular abscess 09/23/2016   REFERRING PROVIDER: Levert Feinstein, MD   REFERRING DIAG: Cervicalgia   THERAPY DIAG:  Cervicalgia  Chronic left shoulder pain  Muscle weakness (generalized)  Rationale for Evaluation and Treatment: Rehabilitation  ONSET DATE: 2015  SUBJECTIVE:                                                                                                                                                                                                         SUBJECTIVE STATEMENT: Patient reports that he is hurting more today. He wants to get a MRI, but he was told that he needs to continue with therapy before he can get it.  Hand dominance: Right  PERTINENT HISTORY:  Current smoker, PTSD, and history of seizures  PAIN:  Are you having pain? Yes: NPRS scale: 7-8/10 Pain location: neck, back, and left shoulder Pain description: sharp, constant  Aggravating factors: standing, using his left arm, and turning his  head Relieving factors: laying down and muscle relaxer's (ease pain some)  PRECAUTIONS: None  WEIGHT BEARING RESTRICTIONS: No  FALLS:  Has patient fallen in last 6 months? No  LIVING ENVIRONMENT: Lives with: lives alone Lives in: House/apartment  OCCUPATION: unable to work  PLOF: Independent  PATIENT GOALS: reduced pain, use his left arm more, and be able to do regular activities  NEXT MD VISIT: 09/16/22  OBJECTIVE:  DIAGNOSTIC FINDINGS: 03/10/22 cervical MRI  IMPRESSION: This MRI of the cervical spine with and without contrast shows the following: The spinal cord appears normal. 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 Normal enhancement pattern.  COGNITION: Overall cognitive status: Within functional limits for tasks assessed  SENSATION: Patient reports no numbness or tingling  POSTURE: rounded shoulders, forward head, and flexed trunk   PALPATION: TTP: left cervical and thoracic paraspinals, upper trapezius, levator scapulae, latissimus dorsi, deltoid, and infraspinatus   CERVICAL ROM:   Active ROM A/PROM (deg) eval  Flexion 36  Extension 18; with tremor at end range and "tight"  Right lateral flexion 44  Left lateral flexion 32; with tremor at end range   Right rotation 52  Left rotation 54; with tremor at end range    (Blank rows = not tested)  UPPER EXTREMITY ROM:  Active ROM Right eval Left eval  Shoulder flexion 170 140  Shoulder extension    Shoulder abduction 150 118  Shoulder adduction    Shoulder extension    Shoulder internal rotation    Shoulder external rotation    Elbow flexion    Elbow extension    Wrist flexion    Wrist extension    Wrist ulnar deviation    Wrist radial deviation    Wrist pronation    Wrist supination     (Blank rows = not tested)  UPPER EXTREMITY MMT:  MMT Right eval  Left eval  Shoulder flexion 4/5 3+/5  Shoulder extension    Shoulder abduction 4+/5 4-/5  Shoulder adduction    Shoulder extension    Shoulder internal rotation    Shoulder external rotation    Middle trapezius    Lower trapezius    Elbow flexion 4/5 4-/5  Elbow extension 4/5 3+/5  Wrist flexion    Wrist extension    Wrist ulnar deviation    Wrist radial deviation    Wrist pronation    Wrist supination    Grip strength 85 70   (Blank rows = not tested)  CERVICAL SPECIAL TESTS:  Spurling's test: Negative, Distraction test: Positive, and Sharp pursor's test: Negative  TODAY'S TREATMENT:                                                                                                                              DATE:                                     4/24 EXERCISE LOG  Exercise Repetitions and Resistance Comments  Scapular retraction  10 reps    Shoulder shrugs 10 reps    Therabar bending  Red t-bar x 5 reps each  Limited due to pain  Isometric IR  20 reps w/ brief hold  Seated pendulum  30 seconds   Ball roll out  1.5 minutes    Blank cell = exercise not performed today                                    4/4 EXERCISE LOG  Exercise Repetitions and Resistance Comments  Ball roll out  2 minutes Small arc of motion   Isometric ball press  20 reps   Isometric shoulder extension 20 reps    Supine cervical rotation  7 reps each    Bilateral ER  15 reps  No resistance  Isometric IR 25 reps  Ball squeeze  Resisted scapular circles Yellow t-band x 20 reps each     Blank cell = exercise not performed today                                    4/2 EXERCISE LOG  Exercise Repetitions and Resistance Comments  Pulleys 2 minutes   Scapular retraction  10 reps    Chin tucks  10 reps             Blank cell = exercise not performed today  Modalities: no redness or adverse reaction to today's modalities  Date:  Unattended Estim: left upper trapezius and scapular stabilizers,  IFC @ 80-150 Hz w/ 40% scan, 13 mins, Pain and Tone  PATIENT EDUCATION:  Education details: Statistician Person educated: Patient Education method: Explanation Education comprehension: verbalized understanding  HOME EXERCISE PROGRAM:   ASSESSMENT:  CLINICAL IMPRESSION: Patient presented to treatment reported increased pain which limited his ability to be introduced to new interventions. Pain was his primary limiting factor with today's interventions. He reported an increase his familiar symptoms with all of today's interventions. He reported that he was still hurting at the conclusion of treatment. He may benefit from additional medical interventions due to his continued high pain severity and irritability which limits his ability to complete physical therapy.   OBJECTIVE IMPAIRMENTS: decreased activity tolerance, decreased ROM, decreased strength, hypomobility, increased muscle spasms, impaired tone, impaired UE functional use, postural dysfunction, and pain.   ACTIVITY LIMITATIONS: carrying, lifting, reach over head, and hygiene/grooming  PARTICIPATION LIMITATIONS: community activity  PERSONAL FACTORS: Behavior pattern, Time since onset of injury/illness/exacerbation, and 3+ comorbidities: Current smoker, PTSD, and history of seizures  are also affecting patient's functional outcome.   REHAB POTENTIAL: Fair    CLINICAL DECISION MAKING: Evolving/moderate complexity  EVALUATION COMPLEXITY: Moderate   GOALS: Goals reviewed with patient? Yes  LONG TERM GOALS: Target date: 05/21/22  Patient will be independent with his HEP. Baseline: No HEP as of initial evaluation Goal status: INITIAL  2.  Patient will be able to demonstrate at least 60 degrees of cervical rotation bilaterally. Baseline: See objective Goal status: INITIAL  3.  Patient will be able to complete his daily activities without his familiar pain exceeding 5/10. Baseline: 7/10 Goal status: INITIAL  4.   Patient will be able to demonstrate improved left upper extremity strength to at least 4/5 for improved function with daily activities. Baseline: See objective Goal status: INITIAL  PLAN:  PT FREQUENCY: 2x/week  PT DURATION: 4 weeks  PLANNED INTERVENTIONS: Therapeutic exercises, Therapeutic activity, Neuromuscular re-education, Patient/Family education, Self Care, Joint mobilization, Electrical stimulation, Spinal mobilization, Cryotherapy, Moist heat, Traction, Ultrasound, Manual therapy, and Re-evaluation  PLAN FOR NEXT SESSION:  Chin tucks, active assisted cervical range of motion, pulleys, upper extremity strengthening, and modalities as needed   Granville Lewis, PT 05/21/2022, 2:54 PM

## 2022-05-23 ENCOUNTER — Telehealth: Payer: Self-pay | Admitting: Internal Medicine

## 2022-05-23 NOTE — Telephone Encounter (Signed)
Spoke to patient who stated after checking his bp noticed that it said he had an irregular heartbeat. Patient stated that for the last month he has been having chest tightness, light headedness, dizziness, sob, and a shooting pain in his neck. Patient stated that he has been agitated lately and feels better after he sleeps. Patient was prescribed Minipress and feels this may be affecting his bp and heart.   Please advise.

## 2022-05-23 NOTE — Telephone Encounter (Signed)
Patient stated that he does not have money to purchase Kardia. Patient is requesting a monitor be placed for 7 days.   Please advise.

## 2022-05-23 NOTE — Telephone Encounter (Signed)
Patient notified via My Chart

## 2022-05-23 NOTE — Telephone Encounter (Signed)
Patient c/o Palpitations:  High priority if patient c/o lightheadedness, shortness of breath, or chest pain  How long have you had palpitations/irregular HR/ Afib? Are you having the symptoms now?   Yes  Are you currently experiencing lightheadedness, SOB or CP?   Tightness in chest some times  Do you have a history of afib (atrial fibrillation) or irregular heart rhythm?   History of having a heart murmur  Have you checked your BP or HR? (document readings if available):   120/81  HR 62 (while on phone)  Are you experiencing any other symptoms?  Sometimes sick to his stomach   Patient stated he has been feeling sluggish/tired/weak.  Patient states his BP has been fluctuating.

## 2022-05-26 ENCOUNTER — Ambulatory Visit: Payer: Medicaid Other | Attending: Internal Medicine

## 2022-05-26 ENCOUNTER — Other Ambulatory Visit: Payer: Self-pay | Admitting: Internal Medicine

## 2022-05-26 DIAGNOSIS — R002 Palpitations: Secondary | ICD-10-CM

## 2022-05-26 NOTE — Telephone Encounter (Signed)
Patient received message. He would like to clarify whether he is supposed to receive the monitor in the mail or if he is getting it from the office when he has it placed. Please advise.

## 2022-05-27 ENCOUNTER — Ambulatory Visit: Payer: Medicaid Other | Admitting: Physical Therapy

## 2022-05-27 DIAGNOSIS — M542 Cervicalgia: Secondary | ICD-10-CM | POA: Diagnosis not present

## 2022-05-27 DIAGNOSIS — M6281 Muscle weakness (generalized): Secondary | ICD-10-CM

## 2022-05-27 DIAGNOSIS — G8929 Other chronic pain: Secondary | ICD-10-CM

## 2022-05-27 NOTE — Therapy (Signed)
OUTPATIENT PHYSICAL THERAPY CERVICAL TREATMENT   Patient Name: Trevor Pacheco MRN: [id number] DOB:[birth year], 37 y.o., male Today's Date: 05/27/2022  END OF SESSION:  PT End of Session - 05/27/22 1706     Visit Number 5    Number of Visits 8    Date for PT Re-Evaluation 06/27/22    PT Start Time 0400    PT Stop Time 0427    PT Time Calculation (min) 27 min    Activity Tolerance Patient tolerated treatment well    Behavior During Therapy [facility] for tasks assessed/performed              Past Medical History:  Diagnosis Date   Heart murmur    Seizure [facility])    Past Surgical History:  Procedure Laterality Date   ABSCESS DRAINAGE     HAND SURGERY     LESION DESTRUCTION N/A 04/15/2021   Procedure: LESION DESTRUCTION PENIS;  Surgeon: Milderd Meager., MD;  Location: [facility];  Service: Urology;  Laterality: N/A;   Patient Active Problem List   Diagnosis Date Noted   Non-cardiac chest pain 04/28/2022   Confusion 06/13/2021   Penile lesion 04/02/2021   Submandibular abscess 09/23/2016   REFERRING PROVIDER: Levert Feinstein, MD   REFERRING DIAG: Cervicalgia   THERAPY DIAG:  Cervicalgia  Chronic left shoulder pain  Muscle weakness (generalized)  Rationale for Evaluation and Treatment: Rehabilitation  ONSET DATE: 2015  SUBJECTIVE:                                                                                                                                                                                                         SUBJECTIVE STATEMENT: Pain about a 5. PERTINENT HISTORY:  Current smoker, PTSD, and history of seizures  PAIN:  Are you having pain? 5/10.  Left cervical.  PRECAUTIONS: None  WEIGHT BEARING RESTRICTIONS: No  FALLS:  Has patient fallen in last 6 months? No  LIVING ENVIRONMENT: Lives with: lives alone Lives in: House/apartment  OCCUPATION: unable to work  PLOF: Independent  PATIENT GOALS: reduced pain, use his left arm more, and  be able to do regular activities  NEXT MD VISIT: 09/16/22  OBJECTIVE:   DIAGNOSTIC FINDINGS: 03/10/22 cervical MRI  IMPRESSION: This MRI of the cervical spine with and without contrast shows the following: The spinal cord appears normal. 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 Normal enhancement pattern.  COGNITION: Overall cognitive status: Within functional limits  for tasks assessed  SENSATION: Patient reports no numbness or tingling  POSTURE: rounded shoulders, forward head, and flexed trunk   PALPATION: TTP: left cervical and thoracic paraspinals, upper trapezius, levator scapulae, latissimus dorsi, deltoid, and infraspinatus   CERVICAL ROM:   Active ROM A/PROM (deg) eval  Flexion 36  Extension 18; with tremor at end range and "tight"  Right lateral flexion 44  Left lateral flexion 32; with tremor at end range   Right rotation 52  Left rotation 54; with tremor at end range    (Blank rows = not tested)  UPPER EXTREMITY ROM:  Active ROM Right eval Left eval  Shoulder flexion 170 140  Shoulder extension    Shoulder abduction 150 118  Shoulder adduction    Shoulder extension    Shoulder internal rotation    Shoulder external rotation    Elbow flexion    Elbow extension    Wrist flexion    Wrist extension    Wrist ulnar deviation    Wrist radial deviation    Wrist pronation    Wrist supination     (Blank rows = not tested)  UPPER EXTREMITY MMT:  MMT Right eval Left eval  Shoulder flexion 4/5 3+/5  Shoulder extension    Shoulder abduction 4+/5 4-/5  Shoulder adduction    Shoulder extension    Shoulder internal rotation    Shoulder external rotation    Middle trapezius    Lower trapezius    Elbow flexion 4/5 4-/5  Elbow extension 4/5 3+/5  Wrist flexion    Wrist extension    Wrist ulnar deviation    Wrist radial  deviation    Wrist pronation    Wrist supination    Grip strength 85 70   (Blank rows = not tested)  CERVICAL SPECIAL TESTS:  Spurling's test: Negative, Distraction test: Positive, and Sharp pursor's test: Negative  TODAY'S TREATMENT:                                                                                                                              DATE:  05/27/22:  [place] at 1.50 W/CM2 x 12 minutes to patient's left UT f/b gentle STW/M x 11 minutes.  Patient tolerated treatment without complaint with normal modality response following removal of modality.          PATIENT EDUCATION:  Education details: Statistician Person educated: Patient Education method: Explanation Education comprehension: verbalized understanding  HOME EXERCISE PROGRAM:   ASSESSMENT:  CLINICAL IMPRESSION: Patient with a CC of pain over his left UT today.  Increased tone noted including over his Levator Scapulae.  Patient tolerated treatment well but reported no reduction in pain after treatment.   OBJECTIVE IMPAIRMENTS: decreased activity tolerance, decreased ROM, decreased strength, hypomobility, increased muscle spasms, impaired tone, impaired UE functional use, postural dysfunction, and pain.   ACTIVITY LIMITATIONS: carrying, lifting, reach over head, and hygiene/grooming  PARTICIPATION LIMITATIONS: community activity  PERSONAL FACTORS:  Behavior pattern, Time since onset of injury/illness/exacerbation, and 3+ comorbidities: Current smoker, PTSD, and history of seizures  are also affecting patient's functional outcome.   REHAB POTENTIAL: Fair    CLINICAL DECISION MAKING: Evolving/moderate complexity  EVALUATION COMPLEXITY: Moderate   GOALS: Goals reviewed with patient? Yes  LONG TERM GOALS: Target date: 05/21/22  Patient will be independent with his HEP. Baseline: No HEP as of initial evaluation Goal status: INITIAL  2.  Patient will be able to demonstrate at least 60 degrees  of cervical rotation bilaterally. Baseline: See objective Goal status: INITIAL  3.  Patient will be able to complete his daily activities without his familiar pain exceeding 5/10. Baseline: 7/10 Goal status: INITIAL  4.  Patient will be able to demonstrate improved left upper extremity strength to at least 4/5 for improved function with daily activities. Baseline: See objective Goal status: INITIAL  PLAN:  PT FREQUENCY: 2x/week  PT DURATION: 4 weeks  PLANNED INTERVENTIONS: Therapeutic exercises, Therapeutic activity, Neuromuscular re-education, Patient/Family education, Self Care, Joint mobilization, Electrical stimulation, Spinal mobilization, Cryotherapy, Moist heat, Traction, Ultrasound, Manual therapy, and Re-evaluation  PLAN FOR NEXT SESSION: Chin tucks, active assisted cervical range of motion, pulleys, upper extremity strengthening, and modalities as needed   Maribeth Jiles, [place], PT 05/27/2022, 5:12 PM

## 2022-05-28 DIAGNOSIS — R002 Palpitations: Secondary | ICD-10-CM | POA: Diagnosis not present

## 2022-06-11 ENCOUNTER — Ambulatory Visit (INDEPENDENT_AMBULATORY_CARE_PROVIDER_SITE_OTHER): Payer: Medicaid Other | Admitting: Family Medicine

## 2022-06-11 ENCOUNTER — Encounter: Payer: Self-pay | Admitting: Family Medicine

## 2022-06-11 VITALS — BP 103/59 | HR 69 | Temp 98.9°F | Ht 73.0 in | Wt 171.0 lb

## 2022-06-11 DIAGNOSIS — Z113 Encounter for screening for infections with a predominantly sexual mode of transmission: Secondary | ICD-10-CM

## 2022-06-11 DIAGNOSIS — F4310 Post-traumatic stress disorder, unspecified: Secondary | ICD-10-CM | POA: Insufficient documentation

## 2022-06-11 DIAGNOSIS — Z0000 Encounter for general adult medical examination without abnormal findings: Secondary | ICD-10-CM

## 2022-06-11 DIAGNOSIS — Z0001 Encounter for general adult medical examination with abnormal findings: Secondary | ICD-10-CM

## 2022-06-11 DIAGNOSIS — R413 Other amnesia: Secondary | ICD-10-CM

## 2022-06-11 DIAGNOSIS — R011 Cardiac murmur, unspecified: Secondary | ICD-10-CM | POA: Diagnosis not present

## 2022-06-11 DIAGNOSIS — F1990 Other psychoactive substance use, unspecified, uncomplicated: Secondary | ICD-10-CM | POA: Diagnosis not present

## 2022-06-11 DIAGNOSIS — Z136 Encounter for screening for cardiovascular disorders: Secondary | ICD-10-CM

## 2022-06-11 NOTE — Patient Instructions (Signed)
Health Maintenance, Male Adopting a healthy lifestyle and getting preventive care are important in promoting health and wellness. Ask your health care provider about: The right schedule for you to have regular tests and exams. Things you can do on your own to prevent diseases and keep yourself healthy. What should I know about diet, weight, and exercise? Eat a healthy diet  Eat a diet that includes plenty of vegetables, fruits, low-fat dairy products, and lean protein. Do not eat a lot of foods that are high in solid fats, added sugars, or sodium. Maintain a healthy weight Body mass index (BMI) is a measurement that can be used to identify possible weight problems. It estimates body fat based on height and weight. Your health care provider can help determine your BMI and help you achieve or maintain a healthy weight. Get regular exercise Get regular exercise. This is one of the most important things you can do for your health. Most adults should: Exercise for at least 150 minutes each week. The exercise should increase your heart rate and make you sweat (moderate-intensity exercise). Do strengthening exercises at least twice a week. This is in addition to the moderate-intensity exercise. Spend less time sitting. Even light physical activity can be beneficial. Watch cholesterol and blood lipids Have your blood tested for lipids and cholesterol at 35 years of age, then have this test every 5 years. You may need to have your cholesterol levels checked more often if: Your lipid or cholesterol levels are high. You are older than 35 years of age. You are at high risk for heart disease. What should I know about cancer screening? Many types of cancers can be detected early and may often be prevented. Depending on your health history and family history, you may need to have cancer screening at various ages. This may include screening for: Colorectal cancer. Prostate cancer. Skin cancer. Lung  cancer. What should I know about heart disease, diabetes, and high blood pressure? Blood pressure and heart disease High blood pressure causes heart disease and increases the risk of stroke. This is more likely to develop in people who have high blood pressure readings or are overweight. Talk with your health care provider about your target blood pressure readings. Have your blood pressure checked: Every 3-5 years if you are 18-39 years of age. Every year if you are 40 years old or older. If you are between the ages of 65 and 75 and are a current or former smoker, ask your health care provider if you should have a one-time screening for abdominal aortic aneurysm (AAA). Diabetes Have regular diabetes screenings. This checks your fasting blood sugar level. Have the screening done: Once every three years after age 45 if you are at a normal weight and have a low risk for diabetes. More often and at a younger age if you are overweight or have a high risk for diabetes. What should I know about preventing infection? Hepatitis B If you have a higher risk for hepatitis B, you should be screened for this virus. Talk with your health care provider to find out if you are at risk for hepatitis B infection. Hepatitis C Blood testing is recommended for: Everyone born from 1945 through 1965. Anyone with known risk factors for hepatitis C. Sexually transmitted infections (STIs) You should be screened each year for STIs, including gonorrhea and chlamydia, if: You are sexually active and are younger than 35 years of age. You are older than 35 years of age and your   health care provider tells you that you are at risk for this type of infection. Your sexual activity has changed since you were last screened, and you are at increased risk for chlamydia or gonorrhea. Ask your health care provider if you are at risk. Ask your health care provider about whether you are at high risk for HIV. Your health care provider  may recommend a prescription medicine to help prevent HIV infection. If you choose to take medicine to prevent HIV, you should first get tested for HIV. You should then be tested every 3 months for as long as you are taking the medicine. Follow these instructions at home: Alcohol use Do not drink alcohol if your health care provider tells you not to drink. If you drink alcohol: Limit how much you have to 0-2 drinks a day. Know how much alcohol is in your drink. In the [place] one drink equals one 12 oz bottle of beer (355 mL), one 5 oz glass of wine (148 mL), or one 1 oz glass of hard liquor (44 mL). Lifestyle Do not use any products that contain nicotine or tobacco. These products include cigarettes, chewing tobacco, and vaping devices, such as e-cigarettes. If you need help quitting, ask your health care provider. Do not use street drugs. Do not share needles. Ask your health care provider for help if you need support or information about quitting drugs. General instructions Schedule regular health, dental, and eye exams. Stay current with your vaccines. Tell your health care provider if: You often feel depressed. You have ever been abused or do not feel safe at home. Summary Adopting a healthy lifestyle and getting preventive care are important in promoting health and wellness. Follow your health care provider's instructions about healthy diet, exercising, and getting tested or screened for diseases. Follow your health care provider's instructions on monitoring your cholesterol and blood pressure. This information is not intended to replace advice given to you by your health care provider. Make sure you discuss any questions you have with your health care provider. Document Revised: 06/04/2020 Document Reviewed: 06/04/2020 Elsevier Patient Education  2023 [employer]  

## 2022-06-11 NOTE — Progress Notes (Signed)
Trevor Pacheco is a 37 y.o. male presents to office today for annual physical exam examination.    Concerns today include: 1. None   Occupation: Not working due to back/neck pain  Marital status: none  Diet: states that "decent" eats vegetables, fruit. States that every now and then he eats sweets  Exercise: Walking 2-3 miles per day  Substance use: cannabis, 2-3 days per week  Last eye exam: none  Last dental exam: had all of his teeth pulled 7 months ago. Established with dentistry  Last colonoscopy: not due  Contraceptive: declines vasectomy  Refills needed today: none - gets them through med Assist Other specialists seen: Follows with Psych, Cardiology and Neurology  Fasting today:  no - coffee with sugar  Immunizations needed: Flu Vaccine: no  Tdap Vaccine: no  - every 68yrs - (<3 lifetime doses or unknown): all wounds -- look up need for Tetanus IG - (>=3 lifetime doses): clean/minor wound if >57yrs from previous; all other wounds if >34yrs from previous Zoster Vaccine: not due  (those >50yo, once) Pneumonia Vaccine: not due  (those w/ risk factors) - (<5yr) Both: Immunocompromised, cochlear implant, CSF leak, asplenic, sickle cell, Chronic Renal Failure - (<15yr) PPSV-23 only: Heart dz, lung disease, DM, tobacco abuse, alcoholism, cirrhosis/liver disease. - (>92yr): PPSV13 then PPSV23 in 6-12mths;  - (>67yr): repeat PPSV23 once if pt received prior to 35yo and 73yrs have passed  Past Medical History:  Diagnosis Date   Heart murmur    Seizure (HCC)    Social History   Socioeconomic History   Marital status: Single    Spouse name: Not on file   Number of children: Not on file   Years of education: Not on file   Highest education level: Not on file  Occupational History   Not on file  Tobacco Use   Smoking status: Every Day    Packs/day: .25    Types: Cigarettes   Smokeless tobacco: Never  Vaping Use   Vaping Use: Never used  Substance and Sexual Activity    Alcohol use: Not Currently    Comment: hx heavy etoh.  none since age 68   Drug use: Yes    Types: Marijuana   Sexual activity: Not on file  Other Topics Concern   Not on file  Social History Narrative   Not on file   Social Determinants of Health   Financial Resource Strain: Not on file  Food Insecurity: Not on file  Transportation Needs: Unmet Transportation Needs (12/24/2021)   PRAPARE - Administrator, Civil Service (Medical): Yes    Lack of Transportation (Non-Medical): Yes  Physical Activity: Not on file  Stress: Not on file  Social Connections: Not on file  Intimate Partner Violence: Not on file   Past Surgical History:  Procedure Laterality Date   ABSCESS DRAINAGE     HAND SURGERY     LESION DESTRUCTION N/A 04/15/2021   Procedure: LESION DESTRUCTION PENIS;  Surgeon: Milderd Meager., MD;  Location: [facility];  Service: Urology;  Laterality: N/A;   Family History  Problem Relation Age of Onset   Cirrhosis Father    Alcoholism Father    Seizures Father    Diabetes Maternal Aunt    Alzheimer's disease Neg Hx     Current Outpatient Medications:    acetaminophen (TYLENOL) 500 MG tablet, Take 1,000 mg by mouth every 6 (six) hours as needed for mild pain or moderate pain., Disp: , Rfl:  aspirin 325 MG tablet, Take 325 mg by mouth every other day., Disp: , Rfl:    diphenhydrAMINE (BENADRYL ALLERGY) 25 mg capsule, 1 capsule at bedtime as needed Orally Once a day, Disp: , Rfl:    ibuprofen (ADVIL,MOTRIN) 600 MG tablet, Take 1 tablet (600 mg total) by mouth 4 (four) times daily., Disp: 30 tablet, Rfl: 0   lamoTRIgine (LAMICTAL) 100 MG tablet, TAKE 1 Tablet  BY MOUTH TWICE DAILY, Disp: 180 tablet, Rfl: 3   lamoTRIgine (LAMICTAL) 25 MG tablet, TAKE 1 Tablet  BY MOUTH TWICE DAILY (WITH 100mg  FOR total OF 125mg  TWICE DAILY), Disp: 60 tablet, Rfl: 5   prazosin (MINIPRESS) 1 MG capsule, 1 capsule at bedtime for 1 week, then increase to 2 capsules (2 mg) at  bedtime if needed for nightmares Orally Once a day for 30 days, Disp: , Rfl:    sertraline (ZOLOFT) 100 MG tablet, Take 200 mg by mouth daily., Disp: , Rfl:   Allergies  Allergen Reactions   Clindamycin/Lincomycin Other (See Comments)    ?  Pt says it caused an infection in his throat)   Penicillins Nausea And Vomiting    ROS: Review of Systems Review of Systems  Musculoskeletal:  Positive for myalgias and neck pain.  All other systems reviewed and are negative.   Physical exam    06/11/2022   11:05 AM 05/14/2022   10:25 AM 04/28/2022    7:54 AM  Vitals with BMI  Height 6\' 1"  6\' 1"  6\' 1"   Weight 171 lbs 163 lbs 171 lbs  BMI 22.57 21.51 22.57  Systolic 103 106 96  Diastolic 59 76 50  Pulse 69 75 74    Physical Exam Constitutional:      General: He is awake. He is not in acute distress.    Appearance: Normal appearance. He is well-developed and normal weight. He is not ill-appearing, toxic-appearing or diaphoretic.     Interventions: He is not intubated.    Comments: Not well-groomed   HENT:     Right Ear: Tympanic membrane normal.     Left Ear: Tympanic membrane is scarred.     Nose: Nose normal.     Mouth/Throat:     Mouth: Mucous membranes are moist.  Eyes:     Extraocular Movements: Extraocular movements intact.     Conjunctiva/sclera: Conjunctivae normal.     Pupils: Pupils are equal, round, and reactive to light.     Comments: Patient struggled "with concentration" during EOM exam.   Neck:     Thyroid: No thyroid mass or thyromegaly.     Trachea: Trachea and phonation normal. No tracheal tenderness, tracheostomy, abnormal tracheal secretions or tracheal deviation.  Cardiovascular:     Rate and Rhythm: Normal rate and regular rhythm. No extrasystoles are present.    Pulses: Normal pulses.          Radial pulses are 2+ on the right side and 2+ on the left side.       Posterior tibial pulses are 2+ on the right side and 2+ on the left side.     Heart sounds: Murmur  heard.     No gallop.  Pulmonary:     Effort: Pulmonary effort is normal. No tachypnea, bradypnea, accessory muscle usage, prolonged expiration, respiratory distress or retractions. He is not intubated.     Breath sounds: Normal breath sounds. No stridor, decreased air movement or transmitted upper airway sounds. No decreased breath sounds, wheezing, rhonchi or rales.  Abdominal:  General: Abdomen is flat. Bowel sounds are normal.     Palpations: Abdomen is soft.     Tenderness: There is no abdominal tenderness.  Genitourinary:    Comments: Deferred  Musculoskeletal:     Cervical back: Signs of trauma and rigidity present. No edema. Pain with movement and muscular tenderness present. Decreased range of motion.     Right lower leg: No edema.     Left lower leg: No edema.  Lymphadenopathy:     Cervical: Cervical adenopathy present.     Left cervical: Posterior cervical adenopathy present.  Skin:    General: Skin is warm.     Capillary Refill: Capillary refill takes less than 2 seconds.  Neurological:     General: No focal deficit present.     Mental Status: He is alert and easily aroused.     GCS: GCS eye subscore is 4. GCS verbal subscore is 5. GCS motor subscore is 6.     Cranial Nerves: Cranial nerves 2-12 are intact.     Motor: Tremor and abnormal muscle tone present.     Coordination: Coordination abnormal.     Gait: Gait is intact.  Psychiatric:        Attention and Perception: Attention normal.        Mood and Affect: Mood normal.        Speech: Speech normal.        Behavior: Behavior normal. Behavior is cooperative.        Thought Content: Thought content normal. Thought content does not include homicidal or suicidal ideation. Thought content does not include homicidal or suicidal plan.        Judgment: Judgment normal.     Comments: At baseline of memory and cognition         06/11/2022   11:11 AM 05/14/2022   10:35 AM  Depression screen PHQ 2/9  Decreased  Interest 1 2  Down, Depressed, Hopeless 1 2  PHQ - 2 Score 2 4  Altered sleeping 1 1  Tired, decreased energy 1 1  Change in appetite 0 1  Feeling bad or failure about yourself  1 3  Trouble concentrating 2 3  Moving slowly or fidgety/restless 1 3  Suicidal thoughts 0 0  PHQ-9 Score 8 16  Difficult doing work/chores Very difficult Extremely dIfficult      06/11/2022   11:12 AM 05/14/2022   10:36 AM  GAD 7 : Generalized Anxiety Score  Nervous, Anxious, on Edge 1 0  Control/stop worrying 1 0  Worry too much - different things 2 3  Trouble relaxing 2 2  Restless 1 1  Easily annoyed or irritable 3 2  Afraid - awful might happen 1 0  Total GAD 7 Score 11 8   Assessment/ Plan: Smitty Cords here for annual physical exam.  1. Routine general medical examination at a health care facility Discussed with patient to continue healthy lifestyle choices, including diet (rich in fruits, vegetables, and lean proteins, and low in salt and simple carbohydrates) and exercise (at least 30 minutes of moderate physical activity daily). Limit beverages high is sugar. Recommended at least 80-100 oz of water daily.   2. Substance use Patient reported cannabis use. Will collect labs as below. Discussed with patient the risks of use with his medications.  - VITAMIN D 25 Hydroxy (Vit-D Deficiency, Fractures); Future - CBC with Differential/Platelet; Future - CMP14+EGFR; Future - TSH; Future  3. Murmur, cardiac Managed by Cardiology. Per chart review of  note 04/28/22, patient has had CTA cardiac, echocardiogram, and event monitor in 2023 that were unremarkable. He recently completed a 7 day zio monitor.   4. Encounter for screening for cardiovascular disorders Labs as below. Will communicate results to patient once available.  Not fasting today. States that he will come in tomorrow morning when fasting.  - Lipid panel; Future  5. Routine screening for STI (sexually transmitted infection) Labs as  below. Will communicate results to patient once available.  - HepB+HepC+HIV Panel; Future - RPR; Future - Urine cytology ancillary only; Future  6. PTSD (post-traumatic stress disorder) Managed by psychiatry. Denies SI. Has follow up scheduled.   7. Impaired memory Managed by neurology. Patient had MMSE of 15/30 at last visit. Instructed patient to follow up with Neurology.   Patient to follow up in 1 year for annual exam or sooner if needed.  The above assessment and management plan was discussed with the patient. The patient verbalized understanding of and has agreed to the management plan. Patient is aware to call the clinic if symptoms persist or worsen. Patient is aware when to return to the clinic for a follow-up visit. Patient educated on when it is appropriate to go to the emergency department.   Neale Burly, DNP-FNP [facility] [address] [place], [place] [address] [phone]

## 2022-06-12 ENCOUNTER — Other Ambulatory Visit: Payer: Medicaid Other

## 2022-06-12 ENCOUNTER — Other Ambulatory Visit (HOSPITAL_COMMUNITY)
Admission: RE | Admit: 2022-06-12 | Discharge: 2022-06-12 | Disposition: A | Discharge status: Home / Self Care | Payer: Medicaid Other | Source: Ambulatory Visit | Attending: Family Medicine | Admitting: Family Medicine

## 2022-06-12 DIAGNOSIS — Z113 Encounter for screening for infections with a predominantly sexual mode of transmission: Secondary | ICD-10-CM | POA: Diagnosis not present

## 2022-06-12 DIAGNOSIS — E782 Mixed hyperlipidemia: Secondary | ICD-10-CM

## 2022-06-12 DIAGNOSIS — E559 Vitamin D deficiency, unspecified: Secondary | ICD-10-CM

## 2022-06-12 DIAGNOSIS — F1990 Other psychoactive substance use, unspecified, uncomplicated: Secondary | ICD-10-CM | POA: Diagnosis not present

## 2022-06-12 DIAGNOSIS — Z136 Encounter for screening for cardiovascular disorders: Secondary | ICD-10-CM | POA: Diagnosis not present

## 2022-06-12 DIAGNOSIS — R6889 Other general symptoms and signs: Secondary | ICD-10-CM | POA: Diagnosis not present

## 2022-06-12 LAB — CBC WITH DIFFERENTIAL/PLATELET
Immature Granulocytes: 0 %
Lymphocytes Absolute: 2.3 10*3/uL (ref 0.7–3.1)
Lymphs: 19 %
Monocytes Absolute: 0.8 10*3/uL (ref 0.1–0.9)
Platelets: 344 10*3/uL (ref 150–450)
RBC: 4.8 x10E6/uL (ref 4.14–5.80)

## 2022-06-12 LAB — HEPB+HEPC+HIV PANEL

## 2022-06-12 LAB — CMP14+EGFR

## 2022-06-12 LAB — LIPID PANEL

## 2022-06-13 ENCOUNTER — Other Ambulatory Visit: Payer: Self-pay | Admitting: Adult Health

## 2022-06-13 LAB — HEPB+HEPC+HIV PANEL
Hep B C IgM: NEGATIVE
Hep B Core Total Ab: NEGATIVE
Hep B E Ag: NEGATIVE
Hep C Virus Ab: NONREACTIVE

## 2022-06-13 LAB — CMP14+EGFR
AST: 17 IU/L (ref 0–40)
Albumin/Globulin Ratio: 1.7 (ref 1.2–2.2)
Albumin: 4.3 g/dL (ref 4.1–5.1)
Alkaline Phosphatase: 53 IU/L (ref 44–121)
BUN/Creatinine Ratio: 10 (ref 9–20)
BUN: 9 mg/dL (ref 6–20)
CO2: 20 mmol/L (ref 20–29)
Chloride: 106 mmol/L (ref 96–106)
Glucose: 97 mg/dL (ref 70–99)
Potassium: 4.7 mmol/L (ref 3.5–5.2)
Sodium: 141 mmol/L (ref 134–144)
Total Protein: 6.8 g/dL (ref 6.0–8.5)
eGFR: 113 mL/min/{1.73_m2} (ref >59)

## 2022-06-13 LAB — URINE CYTOLOGY ANCILLARY ONLY
Chlamydia: NEGATIVE
Comment: NEGATIVE
Comment: NEGATIVE
Comment: NORMAL
Neisseria Gonorrhea: NEGATIVE
Trichomonas: NEGATIVE

## 2022-06-13 LAB — LIPID PANEL
Chol/HDL Ratio: 5.1 ratio — ABNORMAL HIGH (ref 0.0–5.0)
Cholesterol, Total: 198 mg/dL (ref 100–199)
HDL: 39 mg/dL — ABNORMAL LOW (ref >39)
LDL Chol Calc (NIH): 142 mg/dL — ABNORMAL HIGH (ref 0–99)
VLDL Cholesterol Cal: 17 mg/dL (ref 5–40)

## 2022-06-13 LAB — CBC WITH DIFFERENTIAL/PLATELET
Basophils Absolute: 0.1 10*3/uL (ref 0.0–0.2)
Basos: 1 %
EOS (ABSOLUTE): 0.4 10*3/uL (ref 0.0–0.4)
Eos: 3 %
Hematocrit: 44.8 % (ref 37.5–51.0)
Hemoglobin: 14.7 g/dL (ref 13.0–17.7)
Immature Grans (Abs): 0 10*3/uL (ref 0.0–0.1)
MCH: 30.6 pg (ref 26.6–33.0)
MCHC: 32.8 g/dL (ref 31.5–35.7)
MCV: 93 fL (ref 79–97)
Monocytes: 6 %
Neutrophils Absolute: 8.3 10*3/uL — ABNORMAL HIGH (ref 1.4–7.0)
Neutrophils: 71 %
RDW: 12.7 % (ref 11.6–15.4)
WBC: 11.8 10*3/uL — ABNORMAL HIGH (ref 3.4–10.8)

## 2022-06-13 LAB — RPR: RPR Ser Ql: NONREACTIVE

## 2022-06-13 LAB — TSH: TSH: 1.09 u[IU]/mL (ref 0.450–4.500)

## 2022-06-13 LAB — VITAMIN D 25 HYDROXY (VIT D DEFICIENCY, FRACTURES): Vit D, 25-Hydroxy: 14.5 ng/mL — ABNORMAL LOW (ref 30.0–100.0)

## 2022-06-16 DIAGNOSIS — F410 Panic disorder [episodic paroxysmal anxiety] without agoraphobia: Secondary | ICD-10-CM | POA: Diagnosis not present

## 2022-06-16 DIAGNOSIS — F329 Major depressive disorder, single episode, unspecified: Secondary | ICD-10-CM | POA: Diagnosis not present

## 2022-06-16 DIAGNOSIS — F4310 Post-traumatic stress disorder, unspecified: Secondary | ICD-10-CM | POA: Diagnosis not present

## 2022-07-03 ENCOUNTER — Telehealth: Payer: Self-pay | Admitting: Internal Medicine

## 2022-07-03 NOTE — Telephone Encounter (Signed)
Follow Up:   Patient is calling for his Monitor results please. He also wants o know what is the next step for him?

## 2022-07-04 NOTE — Telephone Encounter (Signed)
Pt notified that cardiac monitor and cardiac work up are both normal.

## 2022-07-08 ENCOUNTER — Telehealth: Payer: Self-pay | Admitting: *Deleted

## 2022-07-08 NOTE — Telephone Encounter (Signed)
-----   Message from Marjo Bicker, MD sent at 07/08/2022  8:53 AM EDT ----- Regarding: RE: pt wants stress test No indication of stress test as CTA cardiac was unremarkable in 2022.  ----- Message ----- From: Nori Riis, RN Sent: 07/07/2022   4:14 PM EDT To: Orion Modest Mallipeddi, MD Subject: pt wants stress test                           Pt wants stress test because he has never had one. I discussed echo,monitor and coronary ct results with him and discussed normal findings.  Please advise,thanks

## 2022-07-08 NOTE — Telephone Encounter (Signed)
Returned call to pt. No answer. No voice mail.  

## 2022-07-08 NOTE — Telephone Encounter (Signed)
Pt returned call and was notified that he does not need a stress test at this time.

## 2022-07-10 ENCOUNTER — Telehealth: Payer: Self-pay | Admitting: Adult Health

## 2022-07-10 NOTE — Telephone Encounter (Signed)
Pt stated he went to therapy and it's didn't help his pain. Stated he was told to follow-up with [facility]. Pt stated he needs to know the next step in his care

## 2022-08-08 ENCOUNTER — Ambulatory Visit (INDEPENDENT_AMBULATORY_CARE_PROVIDER_SITE_OTHER): Payer: Medicaid Other | Admitting: Family Medicine

## 2022-08-08 ENCOUNTER — Encounter: Payer: Self-pay | Admitting: Family Medicine

## 2022-08-08 VITALS — BP 126/76 | HR 64 | Temp 98.7°F | Ht 73.0 in | Wt 156.0 lb

## 2022-08-08 DIAGNOSIS — R1909 Other intra-abdominal and pelvic swelling, mass and lump: Secondary | ICD-10-CM | POA: Diagnosis not present

## 2022-08-08 DIAGNOSIS — E782 Mixed hyperlipidemia: Secondary | ICD-10-CM

## 2022-08-08 DIAGNOSIS — E559 Vitamin D deficiency, unspecified: Secondary | ICD-10-CM

## 2022-08-08 NOTE — Progress Notes (Signed)
Acute Office Visit  Subjective:  Patient ID: Trevor Pacheco, Trevor Pacheco    DOB: 06-05-1985, 37 y.o.   MRN: [id number]  Chief Complaint  Patient presents with   Groin Pain    Knots/bumps with pain, left groin   Groin Pain   Patient is in today for a knot in his abdomen/groin that he has had since 2012. States that it would flare up and then go away. States that 2 week ago he felt two lumps at the site. 4 days ago it started hurting him and bothering him more. States that the pain radiates from his hip to groin area of left testicle. Reports shooting pain. States that he has not tried tylenol or ibuprofen because "they don't work". States that he has not yet tried heat or ice.   Has follow up with psychiatry on Monday, seen at [facility]   ROS As per HPI Objective:  BP 126/76   Pulse 64   Temp 98.7 F (37.1 C)   Ht 6\' 1"  (1.854 m)   Wt 156 lb (70.8 kg)   SpO2 96%   BMI 20.58 kg/m    Physical Exam Constitutional:      General: He is awake. He is not in acute distress.    Appearance: Normal appearance. He is well-developed. He is not ill-appearing, toxic-appearing or diaphoretic.     Comments: Not well groomed   Cardiovascular:     Rate and Rhythm: Normal rate.     Pulses: Normal pulses.          Radial pulses are 2+ on the right side and 2+ on the left side.       Posterior tibial pulses are 2+ on the right side and 2+ on the left side.     Heart sounds: Normal heart sounds. No murmur heard.    No gallop.  Pulmonary:     Effort: Pulmonary effort is normal. No respiratory distress.     Breath sounds: Normal breath sounds. No stridor. No wheezing, rhonchi or rales.  Abdominal:     General: There is no distension.     Tenderness: There is abdominal tenderness.       Comments: Two small tender, palpable nodules on left inguinal area, not protruding   Musculoskeletal:     Cervical back: Full passive range of motion without pain and neck supple.     Right lower leg: No  edema.     Left lower leg: No edema.  Skin:    General: Skin is warm.     Capillary Refill: Capillary refill takes less than 2 seconds.  Neurological:     General: No focal deficit present.     Mental Status: He is alert, oriented to person, place, and time and easily aroused. Mental status is at baseline.     GCS: GCS eye subscore is 4. GCS verbal subscore is 5. GCS motor subscore is 6.     Motor: No weakness.  Psychiatric:        Attention and Perception: Attention and perception normal.        Mood and Affect: Affect normal. Mood is anxious.        Speech: Speech normal.        Behavior: Behavior normal. Behavior is cooperative.        Thought Content: Thought content normal. Thought content does not include homicidal or suicidal ideation. Thought content does not include homicidal or suicidal plan.  Cognition and Memory: Cognition is impaired. Memory is impaired.        Judgment: Judgment normal.         08/08/2022    2:14 PM 06/11/2022   11:11 AM 05/14/2022   10:35 AM  Depression screen PHQ 2/9  Decreased Interest 2 1 2   Down, Depressed, Hopeless 0 1 2  PHQ - 2 Score 2 2 4   Altered sleeping 1 1 1   Tired, decreased energy 1 1 1   Change in appetite 0 0 1  Feeling bad or failure about yourself  1 1 3   Trouble concentrating 2 2 3   Moving slowly or fidgety/restless 1 1 3   Suicidal thoughts 0 0 0  PHQ-9 Score 8 8 16   Difficult doing work/chores Somewhat difficult Very difficult Extremely dIfficult       08/08/2022    2:15 PM 06/11/2022   11:12 AM 05/14/2022   10:36 AM  GAD 7 : Generalized Anxiety Score  Nervous, Anxious, on Edge 0 1 0  Control/stop worrying 1 1 0  Worry too much - different things 1 2 3   Trouble relaxing 1 2 2   Restless 1 1 1   Easily annoyed or irritable 3 3 2   Afraid - awful might happen 0 1 0  Total GAD 7 Score 7 11 8   Anxiety Difficulty Somewhat difficult      Assessment & Plan:  1. Nodule of groin Imaging as below to rule out possible  hernia. Discussed with patient options for pain control such as NSAIDs, tylenol, heat and ice.  - US Abdomen Limited; Future  2. Vitamin D deficiency Labs as below. Will communicate results to patient once available.  Patient has not started vitamin d supplementation. - VITAMIN D 25 Hydroxy (Vit-D Deficiency, Fractures)  3. Mixed hyperlipidemia Labs as below. Will communicate results to patient once available.  Nonfasting  - Lipid panel  The above assessment and management plan was discussed with the patient. The patient verbalized understanding of and has agreed to the management plan using shared-decision making. Patient is aware to call the clinic if they develop any new symptoms or if symptoms fail to improve or worsen. Patient is aware when to return to the clinic for a follow-up visit. Patient educated on when it is appropriate to go to the emergency department.   Return if symptoms worsen or fail to improve.  Neale Burly, DNP-FNP [facility] [address] [place], [place] [address] [phone]

## 2022-08-09 LAB — LIPID PANEL
Chol/HDL Ratio: 4.7 ratio (ref 0.0–5.0)
Cholesterol, Total: 196 mg/dL (ref 100–199)
HDL: 42 mg/dL (ref >39)
LDL Chol Calc (NIH): 141 mg/dL — ABNORMAL HIGH (ref 0–99)
Triglycerides: 72 mg/dL (ref 0–149)
VLDL Cholesterol Cal: 13 mg/dL (ref 5–40)

## 2022-08-09 LAB — VITAMIN D 25 HYDROXY (VIT D DEFICIENCY, FRACTURES): Vit D, 25-Hydroxy: 20 ng/mL — ABNORMAL LOW (ref 30.0–100.0)

## 2022-08-11 DIAGNOSIS — F410 Panic disorder [episodic paroxysmal anxiety] without agoraphobia: Secondary | ICD-10-CM | POA: Diagnosis not present

## 2022-08-11 DIAGNOSIS — F329 Major depressive disorder, single episode, unspecified: Secondary | ICD-10-CM | POA: Diagnosis not present

## 2022-08-11 DIAGNOSIS — F4310 Post-traumatic stress disorder, unspecified: Secondary | ICD-10-CM | POA: Diagnosis not present

## 2022-08-11 MED ORDER — VITAMIN D (ERGOCALCIFEROL) 1.25 MG (50000 UNIT) PO CAPS: 50000.0000 [IU] | ORAL_CAPSULE | ORAL | 0 refills | Status: AC

## 2022-08-11 NOTE — Addendum Note (Signed)
Addended by: Neale Burly on: 08/11/2022 09:10 PM   Modules accepted: Orders

## 2022-08-11 NOTE — Progress Notes (Signed)
Vitamin D level is low. I have sent in a weekly supplement to take for the next 8 weeks. After that, take a daily OTC vitamin D supplement with 1000-2000 IU. LDL is slightly elevated but improved. Continue to increase intake of fresh fruits and vegetables, increase intake of lean proteins. Bake, broil, or grill foods. Avoid fried, greasy, and fatty foods. Avoid fast foods. Increase intake of fiber-rich whole grains. Exercise encouraged - at least 150 minutes per week and advance as tolerated. Can also try red yeast rice and we will recheck in 3 -6 months

## 2022-08-22 ENCOUNTER — Ambulatory Visit (HOSPITAL_COMMUNITY)
Admission: RE | Admit: 2022-08-22 | Discharge: 2022-08-22 | Disposition: A | Discharge status: Home / Self Care | Payer: Medicaid Other | Source: Ambulatory Visit | Attending: Family Medicine | Admitting: Family Medicine

## 2022-08-22 DIAGNOSIS — R1909 Other intra-abdominal and pelvic swelling, mass and lump: Secondary | ICD-10-CM | POA: Insufficient documentation

## 2022-08-22 DIAGNOSIS — K4090 Unilateral inguinal hernia, without obstruction or gangrene, not specified as recurrent: Secondary | ICD-10-CM | POA: Diagnosis not present

## 2022-08-27 ENCOUNTER — Telehealth: Payer: Self-pay | Admitting: Family Medicine

## 2022-08-27 MED ORDER — IBUPROFEN 600 MG PO TABS: 600.0000 mg | ORAL_TABLET | Freq: Four times a day (QID) | ORAL | 0 refills | Status: DC

## 2022-08-27 NOTE — Telephone Encounter (Signed)
I spoke to PCP and she ok'd rx for ibuprofen 600mg  take QID #30 no refills. Rx sent in and pt is aware.

## 2022-08-27 NOTE — Telephone Encounter (Signed)
Pt wants to know if PCP can send him in a Rx for ibuprofen for his groin pain that he was recently seen for because he can't afford to buy it OTC. Pt uses [facility] in [place].

## 2022-09-02 NOTE — Progress Notes (Signed)
Left inguinal hernia present. Recommend avoiding lifting, abdominal crunches, bench and leg presses. Anything that can strain the abdominal muscles is not recommended. I also recommend avoiding constipation and straining with BM's. Unfortunately, these precautions would be in place as long as you have a hernia. If you would like to continue with these activities, it may be valuable to consult with general surgery to see if surgery is better suited for you. If you would like, I can place that referral for you. If the hernia ever becomes hard, painful, and you are not able to push it back into your abdomen, this is a medical emergency and you need to seek emergency attention.

## 2022-09-16 ENCOUNTER — Telehealth: Payer: Self-pay | Admitting: Adult Health

## 2022-09-16 ENCOUNTER — Encounter: Payer: Self-pay | Admitting: Adult Health

## 2022-09-16 ENCOUNTER — Ambulatory Visit (INDEPENDENT_AMBULATORY_CARE_PROVIDER_SITE_OTHER): Payer: Medicaid Other | Admitting: Adult Health

## 2022-09-16 ENCOUNTER — Telehealth: Payer: Self-pay | Admitting: Family Medicine

## 2022-09-16 VITALS — BP 98/63 | HR 46 | Ht 73.0 in | Wt 150.0 lb

## 2022-09-16 DIAGNOSIS — K4090 Unilateral inguinal hernia, without obstruction or gangrene, not specified as recurrent: Secondary | ICD-10-CM

## 2022-09-16 DIAGNOSIS — R413 Other amnesia: Secondary | ICD-10-CM

## 2022-09-16 DIAGNOSIS — R410 Disorientation, unspecified: Secondary | ICD-10-CM

## 2022-09-16 DIAGNOSIS — M549 Dorsalgia, unspecified: Secondary | ICD-10-CM

## 2022-09-16 DIAGNOSIS — G8929 Other chronic pain: Secondary | ICD-10-CM | POA: Diagnosis not present

## 2022-09-16 DIAGNOSIS — M542 Cervicalgia: Secondary | ICD-10-CM | POA: Diagnosis not present
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
## 2022-09-16 NOTE — Telephone Encounter (Signed)
[employer] medicaid Berkley Harvey: [id number] exp. 09/16/22-10/31/22 sent to GI [phone]

## 2022-09-16 NOTE — Telephone Encounter (Signed)
Referral for pain clinic fax to [facility]. Phone: [phone], Fax: [phone].

## 2022-09-16 NOTE — Telephone Encounter (Signed)
REFERRAL REQUEST Telephone Note  Have you been seen at our office for this problem? yes (Advise that they may need an appointment with their PCP before a referral can be done)  Reason for Referral:surgery to remove bloated hernia near groin area Referral discussed with patient: yes  Best contact number of patient for referral team: [phone]    Has patient been seen by a specialist for this issue before: no  Patient provider preference for referral: no Patient location preference for referral: no   Patient notified that referrals can take up to a week or longer to process. If they haven't heard anything within a week they should call back and speak with the referral department.

## 2022-09-17 NOTE — Telephone Encounter (Signed)
Scheduled 9/4 at 9:30am with [place].

## 2022-09-17 NOTE — Telephone Encounter (Signed)
Please Review

## 2022-09-19 NOTE — Telephone Encounter (Signed)
Patient is requested a Referral to General Surgery for the Hernia. You will need to place the Referral as there is not one placed at this time.  Thank You :)

## 2022-09-27 ENCOUNTER — Other Ambulatory Visit: Payer: Self-pay | Admitting: Family Medicine

## 2022-10-01 NOTE — Telephone Encounter (Addendum)
Pt said came to appt at [facility]. They refuse to see me because did not have the $4.00 copay. Pt said will get the name of the place in [place] that will bill for the $4.00.

## 2022-10-13 DIAGNOSIS — F4310 Post-traumatic stress disorder, unspecified: Secondary | ICD-10-CM | POA: Diagnosis not present

## 2022-10-13 DIAGNOSIS — F410 Panic disorder [episodic paroxysmal anxiety] without agoraphobia: Secondary | ICD-10-CM | POA: Diagnosis not present

## 2022-10-13 DIAGNOSIS — F329 Major depressive disorder, single episode, unspecified: Secondary | ICD-10-CM | POA: Diagnosis not present

## 2022-10-23 ENCOUNTER — Telehealth: Payer: Self-pay | Admitting: Internal Medicine

## 2022-10-23 ENCOUNTER — Encounter: Payer: Self-pay | Admitting: Family Medicine

## 2022-10-23 ENCOUNTER — Ambulatory Visit (INDEPENDENT_AMBULATORY_CARE_PROVIDER_SITE_OTHER): Payer: Medicaid Other

## 2022-10-23 ENCOUNTER — Encounter: Payer: Self-pay | Admitting: General Surgery

## 2022-10-23 ENCOUNTER — Ambulatory Visit: Payer: Medicaid Other | Admitting: General Surgery

## 2022-10-23 ENCOUNTER — Ambulatory Visit: Payer: Medicaid Other | Admitting: Family Medicine

## 2022-10-23 VITALS — BP 103/63 | HR 44 | Temp 97.5°F | Resp 18 | Ht 73.0 in | Wt 147.0 lb

## 2022-10-23 VITALS — BP 83/43 | HR 48 | Temp 98.3°F | Ht 73.0 in | Wt 148.0 lb

## 2022-10-23 DIAGNOSIS — R001 Bradycardia, unspecified: Secondary | ICD-10-CM

## 2022-10-23 DIAGNOSIS — M545 Low back pain, unspecified: Secondary | ICD-10-CM

## 2022-10-23 DIAGNOSIS — K4090 Unilateral inguinal hernia, without obstruction or gangrene, not specified as recurrent: Secondary | ICD-10-CM | POA: Diagnosis not present

## 2022-10-23 DIAGNOSIS — W19XXXA Unspecified fall, initial encounter: Secondary | ICD-10-CM

## 2022-10-23 DIAGNOSIS — R55 Syncope and collapse: Secondary | ICD-10-CM | POA: Diagnosis not present

## 2022-10-23 DIAGNOSIS — R1032 Left lower quadrant pain: Secondary | ICD-10-CM | POA: Diagnosis not present

## 2022-10-23 DIAGNOSIS — F4310 Post-traumatic stress disorder, unspecified: Secondary | ICD-10-CM | POA: Diagnosis not present

## 2022-10-23 DIAGNOSIS — F419 Anxiety disorder, unspecified: Secondary | ICD-10-CM | POA: Diagnosis not present

## 2022-10-23 DIAGNOSIS — I959 Hypotension, unspecified: Secondary | ICD-10-CM

## 2022-10-23 MED ORDER — PREDNISONE 20 MG PO TABS
40.0000 mg | ORAL_TABLET | Freq: Every day | ORAL | 0 refills | Status: AC
Start: 2022-10-23 — End: 2022-10-28
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
## 2022-10-23 NOTE — Progress Notes (Signed)
[facility] History and Physical  Reason for Referral: Left inguinal hernia  Referring Physician:  Arrie Senate, FNP   Chief Complaint   New Patient (Initial Visit)     Trevor Pacheco is a 37 y.o. male.  HPI: Mr. Engen is a 37 yo who has had a hernia since 2012 he reports but in the last 6 months this has gotten worse with the bulge getting bigger. He also is feeling two lymph nodes in the groin and these are tender. He was seen by his PCP earlier today and noted to have a HR in the 30-40s. He was 46 here and asymptomatic with no dizziness reported.   He says his HR is normally in the 50s. His cardiologist has been notified. He has warn monitors in the past he says.  He has seizures but is on medicine and this is stable.   He has no penile drainage or symptoms or reasons to have inflamed nodes.   Past Medical History:  Diagnosis Date   Heart murmur    Seizure [facility])     Past Surgical History:  Procedure Laterality Date   ABSCESS DRAINAGE     HAND SURGERY     LESION DESTRUCTION N/A 04/15/2021   Procedure: LESION DESTRUCTION PENIS;  Surgeon: Milderd Meager., MD;  Location: [facility];  Service: Urology;  Laterality: N/A;    Family History  Problem Relation Age of Onset   Cirrhosis Father    Alcoholism Father    Seizures Father    Diabetes Maternal Aunt    Alzheimer's disease Neg Hx     Social History   Tobacco Use   Smoking status: Every Day    Current packs/day: 0.50    Types: Cigarettes   Smokeless tobacco: Never  Vaping Use   Vaping status: Never Used  Substance Use Topics   Alcohol use: Not Currently    Comment: hx heavy etoh.  none since age 63   Drug use: Yes    Types: Marijuana    Medications: I have reviewed the patient's current medications. Allergies as of 10/23/2022       Reactions   Clindamycin/lincomycin Other (See Comments)   ?  Pt says it caused an infection in his throat)   Penicillins Nausea And Vomiting         Medication List        Accurate as of October 23, 2022  2:11 PM. If you have any questions, ask your nurse or doctor.          acetaminophen 500 MG tablet Commonly known as: TYLENOL Take 1,000 mg by mouth every 6 (six) hours as needed for mild pain or moderate pain.   aspirin 325 MG tablet Take 325 mg by mouth every other day.   Benadryl Allergy 25 mg capsule Generic drug: diphenhydrAMINE 1 capsule at bedtime as needed Orally Once a day   ibuprofen 600 MG tablet Commonly known as: ADVIL Take 1 tablet by mouth 4 times daily   lamoTRIgine 100 MG tablet Commonly known as: LAMICTAL TAKE 1 Tablet  BY MOUTH TWICE DAILY   lamoTRIgine 25 MG tablet Commonly known as: LAMICTAL TAKE 1 Tablet  BY MOUTH TWICE DAILY WITH LAMOTRIGINE 100MG  (IF YOU DEVELOP A RASH OR MISS MORE THAN 2 DAYS OF THERAPY, PLEASE CONTACT YOUR PROVIDER.)   prazosin 1 MG capsule Commonly known as: MINIPRESS 5 mg at bedtime.   predniSONE 20 MG tablet Commonly known as: DELTASONE Take 2 tablets (40  mg total) by mouth daily with breakfast for 5 days. Started by: Neale Burly   sertraline 100 MG tablet Commonly known as: ZOLOFT Take 200 mg by mouth daily.         ROS:  A comprehensive review of systems was negative except for: Respiratory: positive for wheezing Gastrointestinal: positive for left groin hernia Musculoskeletal: positive for back pain, neck pain, and joint pain Neurological: positive for dizziness  Blood pressure 103/63, pulse (!) 44, temperature (!) 97.5 F (36.4 C), temperature source Oral, resp. rate 18, height 6\' 1"  (1.854 m), weight 147 lb (66.7 kg), SpO2 98%. Physical Exam Vitals reviewed.  HENT:     Head: Normocephalic.     Nose: Nose normal.  Eyes:     Extraocular Movements: Extraocular movements intact.  Cardiovascular:     Rate and Rhythm: Bradycardia present.  Pulmonary:     Effort: Pulmonary effort is normal.     Breath sounds: Normal breath sounds.   Abdominal:     General: There is no distension.     Palpations: Abdomen is soft.     Hernia: A hernia is present. Hernia is present in the left inguinal area.       Comments: Reducible, 2 -1cm lymph nodes in the groin region, tender on palpation (see drawing)   Musculoskeletal:     Cervical back: Normal range of motion.  Neurological:     Mental Status: He is alert.     Results: CLINICAL DATA:  Three left inguinal canal lumps with pain for 2 weeks.   EXAM: US PELVIS LIMITED   TECHNIQUE: Targeted grayscale and color flow scanning was performed in the region of concern in the left inguinal canal.   COMPARISON:  None Available.   FINDINGS: No soft tissue mass or complex cyst is seen. Multiple nonenlarged lymph nodes are noted in the inguinal region on the left measuring 0.5 x 0.4 cm and 0.9 cm. A bowel containing hernia noted in the inguinal region on the left measuring 1.6 cm.   IMPRESSION: 1. Left inguinal hernia containing bowel. 2. Nonenlarged left inguinal lymph nodes.     Electronically Signed   By: Thornell Sartorius M.D.   On: 08/30/2022 04:42   Assessment & Plan:  Trevor Pacheco is a 38 y.o. male with a left inguinal hernia and two normal sized nodes that he says are painful and wants removed.   Discussed the risk and benefits including, bleeding, infection, use of mesh, risk of recurrence, risk of nerve damage causing numbness or changes in sensation, risk of damage to the cord structures. The patient understands the risk and benefits of repair with mesh, and has decided to proceed.  We also discussed open versus robotic assisted laparoscopic surgery and the use of mesh. We discussed that I do both robotic and open repairs with mesh, and that these are considered equivalent. We discussed reasons for opting for laparoscopic surgery including if a bilateral repair is needed or if a patient has a recurrence after an open repair. We discussed the option of watch and  wait in men and discussed that in 5 years some studies report that 40% of men have crossed over to needing a hernia repair because the hernia has become larger or symptomatic. We discussed that women are not appropriate candidate for watchful waiting due to the risk of femoral hernias.   Plan for robotic left inguinal lap assisted hernia repair.    Can excise the nodes at the same times as  the hernia surgery but may not help with the pain or may worsen the pain.   Do not take the Aspirin 325 leading up to surgery for 5 days.  Will need to see what Cardiology thinks about her low heart rate before scheduling surgery.    All questions were answered to the satisfaction of the patient.     Trevor Pacheco 10/23/2022, 2:11 PM

## 2022-10-23 NOTE — Telephone Encounter (Signed)
Pt c/o BP issue: STAT if pt c/o blurred vision, one-sided weakness or slurred speech  1. What are your last 5 BP readings? No  2. Are you having any other symptoms (ex. Dizziness, headache, blurred vision, passed out)? No   3. What is your BP issue? Pt states he is having low BP and HR and needs to speak with a nurse about this. Please advise

## 2022-10-23 NOTE — Telephone Encounter (Signed)
Pt stated he was seen at [facility] by Neale Burly and was told to reach out to our office regarding low bp/hr.   Pt had orthostatic bp's today: Lying- 102/62 Sitting- 83/43 Standing 86/46  Pt also had EKG today that showed Bradycardia with a hr of 35.   Pt stated that he does not feel bad at this time.  Please advise.

## 2022-10-23 NOTE — Patient Instructions (Addendum)
Can excise the nodes at the same times as the hernia surgery but may not help with the pain or may worsen the pain.   Do not take the Aspirin 325 leading up to surgery for 5 days.  Will need to see what Cardiology thinks about her low heart rate before scheduling surgery.   Laparoscopic Inguinal Hernia Repair, Adult Laparoscopic inguinal hernia repair is a surgical procedure to repair a small, weak spot in the groin muscles that allows fat or intestines from inside the abdomen to bulge out (inguinal hernia). This procedure may be planned, or it may be an emergency procedure. During the procedure, tissue that has bulged out is moved back into place, and the opening in the groin muscles is repaired. This is done through three small incisions in the abdomen. A thin tube with a light and camera on the end (laparoscope) is used to help perform the procedure. Tell a health care provider about: Any allergies you have. All medicines you are taking, including vitamins, herbs, eye drops, creams, and over-the-counter medicines. Any problems you or family members have had with anesthetic medicines. Any blood disorders you have. Any surgeries you have had. Any medical conditions you have. Whether you are pregnant or may be pregnant. What are the risks? Generally, this is a safe procedure. However, problems may occur, including: Infection. Bleeding. Allergic reactions to medicines. Damage to nearby structures or organs. Testicle damage or long-term pain and swelling of the scrotum, in males. Inability to completely empty the bladder (urinary retention). Blood clots. A collection of fluid that builds up under the skin (seroma). The hernia coming back (recurrence). What happens before the procedure?  Medicines Ask your health care provider about: Changing or stopping your regular medicines. This is especially important if you are taking diabetes medicines or blood thinners. Taking medicines such as  aspirin and ibuprofen. These medicines can thin your blood. Do not take these medicines unless your health care provider tells you to take them. Taking over-the-counter medicines, vitamins, herbs, and supplements. General instructions Do not use any products that contain nicotine or tobacco for at least 4 weeks before the procedure, if possible. These products include cigarettes, chewing tobacco, and vaping devices, such as e-cigarettes. If you need help quitting, ask your health care provider. Ask your health care provider: How your surgery site will be marked. What steps will be taken to help prevent infection. These steps may include: Removing hair at the surgery site. Washing skin with a germ-killing soap. Taking antibiotic medicine. Plan to have a responsible adult take you home from the hospital or clinic. Plan to have a responsible adult care for you for the time you are told after you leave the hospital or clinic. This is important. What happens during the procedure? An IV will be inserted into one of your veins. You will be given one or more of the following: A medicine to help you relax (sedative). A medicine to make you fall asleep (general anesthetic). Three small incisions will be made in your abdomen. Your abdomen will be inflated with carbon dioxide gas to make the surgical area easier to see. A laparoscope and surgical instruments will be inserted through the incisions. The laparoscope will send images of the inside of your abdomen to a monitor in the room. Tissue that is bulging through the hernia may be removed or moved back into place. The hernia opening will be closed with a sheet of surgical mesh. The surgical instruments and laparoscope will be  removed. Your incisions will be closed with stitches (sutures) and adhesive strips. A bandage (dressing) will be placed over your incisions. The procedure may vary among health care providers and hospitals. What happens after  the procedure? Your blood pressure, heart rate, breathing rate, and blood oxygen level will be monitored until you leave the hospital or clinic. You will be given pain medicine as needed. You may continue to receive medicines and fluids through an IV. The IV will be removed after you can drink fluids. You will be encouraged to get up and move around and to take deep breaths frequently. If you were given a sedative during the procedure, it can affect you for several hours. Do not drive or operate machinery until your health care provider says that it is safe. Summary Laparoscopic inguinal hernia repair is a surgical procedure to repair a small, weak spot in the groin muscles that allows fat or intestines from inside the abdomen to bulge out (inguinal hernia). This procedure is done through three small incisions in the abdomen. A thin tube with a light and camera on the end (laparoscope) is used to help perform the procedure. After the procedure, you will be encouraged to get up and move around and to take deep breaths frequently. This information is not intended to replace advice given to you by your health care provider. Make sure you discuss any questions you have with your health care provider. Document Revised: 09/13/2019 Document Reviewed: 09/13/2019 Elsevier Patient Education  2024 Elsevier Inc.  Open Lymph Node Biopsy An open lymph node biopsy is a procedure to remove a lymph node so that it can be checked for disease. Lymph nodes are part of the body's disease-fighting system (immune system). The immune system protects the body from infections, germs, and diseases. An open lymph node biopsy may be done to: Look for germs or cancer cells in your lymph node. Find out why your lymph node is swollen. Find out more about a condition you have. Lymph nodes are found in many locations in the body. Biopsies are often done on lymph nodes in the head, neck, armpit, or groin. Tell a health care  provider about: Any allergies you have. All medicines you are taking, including vitamins, herbs, eye drops, creams, and over-the-counter medicines. Any problems you or family members have had with anesthetic medicines. Any blood disorders you have. Any surgeries you have had. Any medical conditions you have or have had. Whether you are pregnant or may be pregnant. What are the risks? Generally, this is a safe procedure. However, problems may occur, including: Infection. Bleeding. Allergic reactions to medicines. Damage to surrounding structures or organs, such as a nerve. Scarring. What happens before the procedure? Medicines Ask your health care provider about: Changing or stopping your regular medicines. This is especially important if you are taking diabetes medicines or blood thinners. Taking medicines such as aspirin and ibuprofen. These medicines can thin your blood. Do not take these medicines before the procedure unless your health care provider tells you to take them. Taking over-the-counter medicines, vitamins, herbs, and supplements. Surgery safety Ask your health care provider: How your surgery site will be marked. What steps will be taken to help prevent infection. These steps may include: Removing hair at the surgery site. Washing skin with a germ-killing soap. Receiving antibiotic medicine. General instructions Follow instructions from your health care provider about eating or drinking restrictions. You may have an exam or testing. You may have a blood or urine sample  taken. If you will be going home right after the procedure, plan to have a responsible adult care for you for the time you are told. This is important. What happens during the procedure?  An IV will be inserted into one of your veins. You will be given one or more of the following: A medicine to help you relax (sedative). A medicine to numb the area (local anesthetic). An incision will be made in  the area where your lymph node is located. Your lymph node will be removed. Your incision will be closed with stitches (sutures). An antibiotic ointment may be applied to your incision. A bandage (dressing) will be placed over your incision. The procedure may vary among health care providers and hospitals. What happens after the procedure? Your blood pressure, heart rate, breathing rate, and blood oxygen level will be monitored until you leave the hospital or clinic. Do not drive for 24 hours if you were given a sedative during your procedure. It is up to you to get the results of your procedure. Ask your health care provider, or the department that is doing the procedure, when your results will be ready. Summary An open lymph node biopsy is a procedure to remove a lymph node so that it can be checked for disease. Generally, this is a safe procedure. However, problems may occur, including bleeding, infection, allergic reaction to medicines, and damage to other structures or organs. During the procedure, an incision will be made in the area of the lymph node, the lymph node will be removed, and the incision will be closed with sutures. You will be monitored after the procedure. Do not drive for 24 hours if you were given a sedative during your procedure. This information is not intended to replace advice given to you by your health care provider. Make sure you discuss any questions you have with your health care provider. Document Revised: 10/25/2019 Document Reviewed: 10/27/2019 Elsevier Patient Education  2024 [employer].

## 2022-10-24 NOTE — Telephone Encounter (Signed)
Patient notified and verbalized understanding. Pt had no questions or concerns at this time 

## 2022-10-25 ENCOUNTER — Ambulatory Visit
Admission: RE | Admit: 2022-10-25 | Discharge: 2022-10-25 | Disposition: A | Discharge status: Home / Self Care | Payer: Medicaid Other | Source: Ambulatory Visit | Attending: Adult Health | Admitting: Adult Health

## 2022-10-25 DIAGNOSIS — M546 Pain in thoracic spine: Secondary | ICD-10-CM | POA: Diagnosis not present

## 2022-10-25 DIAGNOSIS — M549 Dorsalgia, unspecified: Secondary | ICD-10-CM | POA: Diagnosis not present

## 2022-10-25 DIAGNOSIS — G8929 Other chronic pain: Secondary | ICD-10-CM | POA: Diagnosis not present

## 2022-10-25 MED ORDER — GADOPICLENOL 0.5 MMOL/ML IV SOLN
7.5000 mL | Freq: Once | INTRAVENOUS | Status: AC | PRN
  Administered 2022-10-25: 7.5 mL via INTRAVENOUS

## 2022-10-28 ENCOUNTER — Telehealth: Payer: Self-pay | Admitting: *Deleted

## 2022-10-28 ENCOUNTER — Encounter: Payer: Self-pay | Admitting: *Deleted

## 2022-10-28 ENCOUNTER — Telehealth: Payer: Self-pay

## 2022-10-28 NOTE — Telephone Encounter (Signed)
Patient agreeable with virtual appointment. Med list reviewed and consent given.

## 2022-10-28 NOTE — Telephone Encounter (Signed)
   Pre-operative Risk Assessment    Patient Name: Trevor Pacheco  DOB: 16-Apr-1985 MRN: [id number]      Request for Surgical Clearance    Procedure:   XI Robotic Assisted Inguinal Hernia Repair W/ Mesh,Left Lymph Node Excision, Groin Left.  Date of Surgery:  Clearance TBD                                 Surgeon:  Dr. Algis Greenhouse Surgeon's Group or Practice Name:  [facility]  Phone number:  [phone] Fax number:  [phone]   Type of Clearance Requested:   - Medical  - Pharmacy:  Hold Aspirin Not Indicated.   Type of Anesthesia:  General    Additional requests/questions:    Signed, Emmit Pomfret   10/28/2022, 12:48 PM

## 2022-10-28 NOTE — Telephone Encounter (Signed)
   Name: Trevor Pacheco  DOB: 12-Apr-1985  MRN: [id number]  Primary Cardiologist: Marjo Bicker, MD   Preoperative team, please contact this patient and set up a phone call appointment for further preoperative risk assessment. Please obtain consent and complete medication review. Thank you for your help.  I confirm that guidance regarding antiplatelet and oral anticoagulation therapy has been completed and, if necessary, noted below.  Guidance for holding aspirin should come from prescribing provider and is currently not managed by cardiology.  I also confirmed the patient resides in the state of [place]. As per [facility] Medical Board telemedicine laws, the patient must reside in the state in which the provider is licensed.   Napoleon Form, Leodis Rains, NP 10/28/2022, 1:08 PM [facility]

## 2022-10-28 NOTE — Telephone Encounter (Signed)
Spoke to pt gave him MR THORACIC SPINE  results Pt expressed understanding and thanked me for calling  Pt states no questions or concerns at this time

## 2022-10-28 NOTE — Telephone Encounter (Signed)
  Patient Consent for Virtual Visit         Trevor Pacheco has provided verbal consent on 10/28/2022 for a virtual visit (video or telephone).   CONSENT FOR VIRTUAL VISIT FOR:  Trevor Pacheco  By participating in this virtual visit I agree to the following:  I hereby voluntarily request, consent and authorize [facility] and its employed or contracted physicians, physician assistants, nurse practitioners or other licensed health care professionals (the Practitioner), to provide me with telemedicine health care services (the "Services") as deemed necessary by the treating Practitioner. I acknowledge and consent to receive the Services by the Practitioner via telemedicine. I understand that the telemedicine visit will involve communicating with the Practitioner through live audiovisual communication technology and the disclosure of certain medical information by electronic transmission. I acknowledge that I have been given the opportunity to request an in-person assessment or other available alternative prior to the telemedicine visit and am voluntarily participating in the telemedicine visit.  I understand that I have the right to withhold or withdraw my consent to the use of telemedicine in the course of my care at any time, without affecting my right to future care or treatment, and that the Practitioner or I may terminate the telemedicine visit at any time. I understand that I have the right to inspect all information obtained and/or recorded in the course of the telemedicine visit and may receive copies of available information for a reasonable fee.  I understand that some of the potential risks of receiving the Services via telemedicine include:  Delay or interruption in medical evaluation due to technological equipment failure or disruption; Information transmitted may not be sufficient (e.g. poor resolution of images) to allow for appropriate medical decision making by the  Practitioner; and/or  In rare instances, security protocols could fail, causing a breach of personal health information.  Furthermore, I acknowledge that it is my responsibility to provide information about my medical history, conditions and care that is complete and accurate to the best of my ability. I acknowledge that Practitioner's advice, recommendations, and/or decision may be based on factors not within their control, such as incomplete or inaccurate data provided by me or distortions of diagnostic images or specimens that may result from electronic transmissions. I understand that the practice of medicine is not an exact science and that Practitioner makes no warranties or guarantees regarding treatment outcomes. I acknowledge that a copy of this consent can be made available to me via my patient portal [facility] MyChart), or I can request a printed copy by calling the office of [facility].    I understand that my insurance will be billed for this visit.   I have read or had this consent read to me. I understand the contents of this consent, which adequately explains the benefits and risks of the Services being provided via telemedicine.  I have been provided ample opportunity to ask questions regarding this consent and the Services and have had my questions answered to my satisfaction. I give my informed consent for the services to be provided through the use of telemedicine in my medical care

## 2022-10-28 NOTE — Telephone Encounter (Signed)
-----   Message from [facility] sent at 10/26/2022  3:11 PM EDT ----- Scan was unremarkable. Spinal cord appears normal. No degenerative changes noted- no spinal stenosis or nerve root compression

## 2022-10-30 DIAGNOSIS — M5020 Other cervical disc displacement, unspecified cervical region: Secondary | ICD-10-CM | POA: Diagnosis not present

## 2022-10-30 DIAGNOSIS — G8929 Other chronic pain: Secondary | ICD-10-CM | POA: Diagnosis not present

## 2022-10-30 DIAGNOSIS — M542 Cervicalgia: Secondary | ICD-10-CM | POA: Diagnosis not present

## 2022-10-30 DIAGNOSIS — M4802 Spinal stenosis, cervical region: Secondary | ICD-10-CM | POA: Diagnosis not present

## 2022-10-30 DIAGNOSIS — M5412 Radiculopathy, cervical region: Secondary | ICD-10-CM | POA: Diagnosis not present

## 2022-11-04 ENCOUNTER — Telehealth: Payer: Self-pay | Admitting: Adult Health

## 2022-11-04 ENCOUNTER — Telehealth: Payer: Self-pay | Admitting: Family Medicine

## 2022-11-04 MED ORDER — LAMOTRIGINE 100 MG PO TABS: 100.0000 mg | ORAL_TABLET | Freq: Two times a day (BID) | ORAL | 2 refills | Status: DC

## 2022-11-04 MED ORDER — LAMOTRIGINE 25 MG PO TABS: ORAL_TABLET | ORAL | 8 refills | Status: DC

## 2022-11-04 MED ORDER — IBUPROFEN 800 MG PO TABS: 800.0000 mg | ORAL_TABLET | Freq: Three times a day (TID) | ORAL | 0 refills | Status: DC | PRN

## 2022-11-04 NOTE — Telephone Encounter (Signed)
Can increase to 800 mg ibuprofen. Recommend that he take it 3 times daily as needed. May take a fourth dose, the absolute max daily dose is 3200 mg per day. Recommend patient take PPI to protect against GI complications of continued NSAID use.

## 2022-11-04 NOTE — Telephone Encounter (Signed)
Pt request refill for lamoTRIgine (LAMICTAL) 100 MG tablet and lamoTRIgine (LAMICTAL) 25 MG tablet sent to [facility] [phone]

## 2022-11-04 NOTE — Telephone Encounter (Signed)
Both refills sent to [facility][id number]

## 2022-11-04 NOTE — Telephone Encounter (Signed)
Tried twice today but unable to reach staff at [facility]. I have faxed a discontinuation notice to the pharmacy for the Lamotrigine 100 mg since patient is now using [facility]. Received a receipt of confirmation.

## 2022-11-06 ENCOUNTER — Telehealth (HOSPITAL_COMMUNITY): Payer: Self-pay | Admitting: Emergency Medicine

## 2022-11-06 NOTE — Telephone Encounter (Signed)
Reviewed GXT protocol with patient. Pt to arrive between 0945-1000am.

## 2022-11-07 ENCOUNTER — Ambulatory Visit (HOSPITAL_COMMUNITY)
Admission: RE | Admit: 2022-11-07 | Discharge: 2022-11-07 | Disposition: A | Discharge status: Home / Self Care | Payer: Medicaid Other | Source: Ambulatory Visit | Attending: Family Medicine | Admitting: Family Medicine

## 2022-11-07 DIAGNOSIS — R55 Syncope and collapse: Secondary | ICD-10-CM | POA: Insufficient documentation

## 2022-11-07 LAB — EXERCISE TOLERANCE TEST
Angina Index: 0
Base ST Depression (mm): 0 mm
Duke Treadmill Score: 6
Estimated workload: 7.2
Exercise duration (min): 5 min
Exercise duration (sec): 57 s
MPHR: 185 {beats}/min
Peak HR: 120 {beats}/min
Percent HR: 64 %
RPE: 15
Rest HR: 49 {beats}/min
ST Depression (mm): 0 mm

## 2022-11-13 DIAGNOSIS — M5412 Radiculopathy, cervical region: Secondary | ICD-10-CM | POA: Diagnosis not present

## 2022-11-17 ENCOUNTER — Ambulatory Visit: Payer: Medicaid Other | Attending: Cardiology

## 2022-11-17 DIAGNOSIS — Z01810 Encounter for preprocedural cardiovascular examination: Secondary | ICD-10-CM | POA: Diagnosis not present

## 2022-11-17 NOTE — Addendum Note (Signed)
Addended by: Ronney Asters on: 11/17/2022 02:43 PM   Modules accepted: Level of Service

## 2022-11-17 NOTE — Progress Notes (Signed)
Virtual Visit via Telephone Note   Because of Trevor Pacheco's co-morbid illnesses, he is at least at moderate risk for complications without adequate follow up.  This format is felt to be most appropriate for this patient at this time.  The patient did not have access to video technology/had technical difficulties with video requiring transitioning to audio format only (telephone).  All issues noted in this document were discussed and addressed.  No physical exam could be performed with this format.  Please refer to the patient's chart for his consent to telehealth for [facility]  Evaluation Performed:  Preoperative cardiovascular risk assessment _____________   Date:  11/17/2022   Patient ID:  Trevor Pacheco, DOB 1985/08/24, MRN [id number] Patient Location:  Home Provider location:   Office  Primary Care Provider:  Arrie Senate, FNP Primary Cardiologist:  Marjo Bicker, MD  Chief Complaint / Patient Profile   37 y.o. y/o male with a h/o noncardiac chest pain who is pending robotic inguinal hernia repair with mesh, and  left lymph node excision, and presents today for telephonic preoperative cardiovascular risk assessment.  History of Present Illness    Trevor Pacheco is a 37 y.o. male who presents via audio/video conferencing for a telehealth visit today.  Pt was last seen in cardiology clinic on 04/28/2022 by Dr. Jenene Slicker.  At that time Trevor Pacheco was doing well .  The patient is now pending procedure as outlined above. Since his last visit, he remains stable from a cardiac standpoint.  Today he denies chest pain, shortness of breath, lower extremity edema, fatigue, palpitations, melena, hematuria, hemoptysis, diaphoresis, weakness, presyncope, syncope, orthopnea, and PND.   Past Medical History    Past Medical History:  Diagnosis Date   Heart murmur    Seizure [facility])    Past Surgical History:  Procedure Laterality Date   ABSCESS  DRAINAGE     HAND SURGERY     LESION DESTRUCTION N/A 04/15/2021   Procedure: LESION DESTRUCTION PENIS;  Surgeon: Milderd Meager., MD;  Location: [facility];  Service: Urology;  Laterality: N/A;    Allergies  Allergies  Allergen Reactions   Clindamycin/Lincomycin Other (See Comments)    ?  Pt says it caused an infection in his throat)   Penicillins Nausea And Vomiting    Home Medications    Prior to Admission medications   Medication Sig Start Date End Date Taking? Authorizing Provider  acetaminophen (TYLENOL) 500 MG tablet Take 1,000 mg by mouth every 6 (six) hours as needed for mild pain or moderate pain.    [provider]  aspirin 325 MG tablet Take 325 mg by mouth every other day.    [provider]  diphenhydrAMINE (BENADRYL ALLERGY) 25 mg capsule 1 capsule at bedtime as needed Orally Once a day    [provider]  ibuprofen (ADVIL) 800 MG tablet Take 1 tablet (800 mg total) by mouth every 8 (eight) hours as needed. 11/04/22   Milian, Aleen Campi, FNP  lamoTRIgine (LAMICTAL) 100 MG tablet Take 1 tablet (100 mg total) by mouth 2 (two) times daily. 11/04/22   Butch Penny, NP  lamoTRIgine (LAMICTAL) 25 MG tablet TAKE 1 Tablet  BY MOUTH TWICE DAILY WITH LAMOTRIGINE 100MG  (IF YOU DEVELOP A RASH OR MISS MORE THAN 2 DAYS OF THERAPY, PLEASE CONTACT YOUR PROVIDER.) 11/04/22   Butch Penny, NP  prazosin (MINIPRESS) 5 MG capsule 5 mg at bedtime. 04/14/22   [provider]  sertraline (ZOLOFT) 100 MG tablet Take 200 mg by mouth daily.    [provider]    Physical Exam    Vital Signs:  Trevor Pacheco does not have vital signs available for review today.  Given telephonic nature of communication, physical exam is limited. AAOx3. NAD. Normal affect.  Speech and respirations are unlabored.  Accessory Clinical Findings    None  Assessment & Plan    1.  Preoperative Cardiovascular Risk Assessment:  XI Robotic Assisted Inguinal  Hernia Repair W/ Mesh,Left Lymph Node Excision, Groin Left.  Dr. Larae Grooms, [facility], fax #[phone]      Primary Cardiologist: Marjo Bicker, MD  Chart reviewed as part of pre-operative protocol coverage. Given past medical history and time since last visit, based on ACC/AHA guidelines, Trevor Pacheco would be at acceptable risk for the planned procedure without further cardiovascular testing.   His RCRI is a very low, 0.4% risk of major cardiac event.  He is able to complete greater than 4 METS of physical activity.  Patient was advised that if he/she develops new symptoms prior to surgery to contact our office to arrange a follow-up appointment.  He verbalized understanding.  I will route this recommendation to the requesting party via Epic fax function and remove from pre-op pool.      Time:   Today, I have spent  7 minutes with the patient with telehealth technology discussing medical history, symptoms, and management plan.  Prior to patient's phone evaluation I spent greater than 10 minutes reviewing their past medical history and cardiac medications.    Ronney Asters, NP  11/17/2022, 7:41 AM

## 2022-11-18 ENCOUNTER — Other Ambulatory Visit: Payer: Self-pay | Admitting: *Deleted

## 2022-11-18 DIAGNOSIS — K4090 Unilateral inguinal hernia, without obstruction or gangrene, not specified as recurrent: Secondary | ICD-10-CM

## 2022-11-18 DIAGNOSIS — R1032 Left lower quadrant pain: Secondary | ICD-10-CM

## 2022-11-21 NOTE — H&P (Signed)
[facility] History and Physical   Reason for Referral: Left inguinal hernia  Referring Physician:  Arrie Senate, FNP     Chief Complaint   New Patient (Initial Visit)        Trevor Pacheco is a 37 y.o. male.  HPI: [name] is a 37 yo who has had a hernia since 2012 he reports but in the last 6 months this has gotten worse with the bulge getting bigger. He also is feeling two lymph nodes in the groin and these are tender. He was seen by his PCP earlier today and noted to have a HR in the 30-40s. He was 46 here and asymptomatic with no dizziness reported.    He says his HR is normally in the 50s. His cardiologist has been notified. He has warn monitors in the past he says.  He has seizures but is on medicine and this is stable.    He has no penile drainage or symptoms or reasons to have inflamed nodes.        Past Medical History:  Diagnosis Date   Heart murmur     Seizure [facility])                 Past Surgical History:  Procedure Laterality Date   ABSCESS DRAINAGE       HAND SURGERY       LESION DESTRUCTION N/A 04/15/2021    Procedure: LESION DESTRUCTION PENIS;  Surgeon: Milderd Meager., MD;  Location: [facility];  Service: Urology;  Laterality: N/A;               Family History  Problem Relation Age of Onset   Cirrhosis Father     Alcoholism Father     Seizures Father     Diabetes Maternal Aunt     Alzheimer's disease Neg Hx            Social History  Social History         Tobacco Use   Smoking status: Every Day      Current packs/day: 0.50      Types: Cigarettes   Smokeless tobacco: Never  Vaping Use   Vaping status: Never Used  Substance Use Topics   Alcohol use: Not Currently      Comment: hx heavy etoh.  none since age 48   Drug use: Yes      Types: Marijuana        Medications: I have reviewed the patient's current medications. Allergies as of 10/23/2022         Reactions    Clindamycin/lincomycin Other  (See Comments)    ?  Pt says it caused an infection in his throat)    Penicillins Nausea And Vomiting            Medication List           Accurate as of October 23, 2022  2:11 PM. If you have any questions, ask your nurse or doctor.              acetaminophen 500 MG tablet Commonly known as: TYLENOL Take 1,000 mg by mouth every 6 (six) hours as needed for mild pain or moderate pain.    aspirin 325 MG tablet Take 325 mg by mouth every other day.    Benadryl Allergy 25 mg capsule Generic drug: diphenhydrAMINE 1 capsule at bedtime as needed Orally Once a day    ibuprofen 600 MG tablet Commonly known as:  ADVIL Take 1 tablet by mouth 4 times daily    lamoTRIgine 100 MG tablet Commonly known as: LAMICTAL TAKE 1 Tablet  BY MOUTH TWICE DAILY    lamoTRIgine 25 MG tablet Commonly known as: LAMICTAL TAKE 1 Tablet  BY MOUTH TWICE DAILY WITH LAMOTRIGINE 100MG  (IF YOU DEVELOP A RASH OR MISS MORE THAN 2 DAYS OF THERAPY, PLEASE CONTACT YOUR PROVIDER.)    prazosin 1 MG capsule Commonly known as: MINIPRESS 5 mg at bedtime.    predniSONE 20 MG tablet Commonly known as: DELTASONE Take 2 tablets (40 mg total) by mouth daily with breakfast for 5 days. Started by: Neale Burly    sertraline 100 MG tablet Commonly known as: ZOLOFT Take 200 mg by mouth daily.               ROS:  A comprehensive review of systems was negative except for: Respiratory: positive for wheezing Gastrointestinal: positive for left groin hernia Musculoskeletal: positive for back pain, neck pain, and joint pain Neurological: positive for dizziness   Blood pressure 103/63, pulse (!) 44, temperature (!) 97.5 F (36.4 C), temperature source Oral, resp. rate 18, height 6\' 1"  (1.854 m), weight 147 lb (66.7 kg), SpO2 98%. Physical Exam Vitals reviewed.  HENT:     Head: Normocephalic.     Nose: Nose normal.  Eyes:     Extraocular Movements: Extraocular movements intact.  Cardiovascular:      Rate and Rhythm: Bradycardia present.  Pulmonary:     Effort: Pulmonary effort is normal.     Breath sounds: Normal breath sounds.  Abdominal:     General: There is no distension.     Palpations: Abdomen is soft.     Hernia: A hernia is present. Hernia is present in the left inguinal area.        Comments: Reducible, 2 -1cm lymph nodes in the groin region, tender on palpation (see drawing)   Musculoskeletal:     Cervical back: Normal range of motion.  Neurological:     Mental Status: He is alert.        Results: CLINICAL DATA:  Three left inguinal canal lumps with pain for 2 weeks.   EXAM: US PELVIS LIMITED   TECHNIQUE: Targeted grayscale and color flow scanning was performed in the region of concern in the left inguinal canal.   COMPARISON:  None Available.   FINDINGS: No soft tissue mass or complex cyst is seen. Multiple nonenlarged lymph nodes are noted in the inguinal region on the left measuring 0.5 x 0.4 cm and 0.9 cm. A bowel containing hernia noted in the inguinal region on the left measuring 1.6 cm.   IMPRESSION: 1. Left inguinal hernia containing bowel. 2. Nonenlarged left inguinal lymph nodes.     Electronically Signed   By: Thornell Sartorius M.D.   On: 08/30/2022 04:42     Assessment & Plan:  [name] is a 37 y.o. male with a left inguinal hernia and two normal sized nodes that he says are painful and wants removed.    Discussed the risk and benefits including, bleeding, infection, use of mesh, risk of recurrence, risk of nerve damage causing numbness or changes in sensation, risk of damage to the cord structures. The patient understands the risk and benefits of repair with mesh, and has decided to proceed.  We also discussed open versus robotic assisted laparoscopic surgery and the use of mesh. We discussed that I do both robotic and open repairs with mesh, and  that these are considered equivalent. We discussed reasons for opting for laparoscopic  surgery including if a bilateral repair is needed or if a patient has a recurrence after an open repair. We discussed the option of watch and wait in men and discussed that in 5 years some studies report that 40% of men have crossed over to needing a hernia repair because the hernia has become larger or symptomatic. We discussed that women are not appropriate candidate for watchful waiting due to the risk of femoral hernias.    Plan for robotic left inguinal lap assisted hernia repair.     Can excise the nodes at the same times as the hernia surgery but may not help with the pain or may worsen the pain.    Do not take the Aspirin 325 leading up to surgery for 5 days.   Will need to see what Cardiology thinks about her low heart rate before scheduling surgery.      All questions were answered to the satisfaction of the patient.         Lucretia Roers 10/23/2022, 2:11 PM

## 2022-11-28 ENCOUNTER — Encounter (HOSPITAL_COMMUNITY)
Admission: RE | Admit: 2022-11-28 | Discharge: 2022-11-28 | Disposition: A | Discharge status: Home / Self Care | Payer: Medicaid Other | Source: Ambulatory Visit | Attending: General Surgery | Admitting: General Surgery

## 2022-12-02 ENCOUNTER — Other Ambulatory Visit: Payer: Self-pay

## 2022-12-02 ENCOUNTER — Encounter (HOSPITAL_COMMUNITY): Payer: Self-pay | Admitting: General Surgery

## 2022-12-02 ENCOUNTER — Ambulatory Visit (HOSPITAL_BASED_OUTPATIENT_CLINIC_OR_DEPARTMENT_OTHER): Payer: Medicaid Other | Admitting: Anesthesiology

## 2022-12-02 ENCOUNTER — Ambulatory Visit (HOSPITAL_COMMUNITY)
Admission: RE | Admit: 2022-12-02 | Discharge: 2022-12-02 | Disposition: A | Discharge status: Home / Self Care | Payer: Medicaid Other | Attending: General Surgery | Admitting: General Surgery

## 2022-12-02 ENCOUNTER — Ambulatory Visit (HOSPITAL_COMMUNITY): Payer: Medicaid Other | Admitting: Anesthesiology

## 2022-12-02 ENCOUNTER — Encounter (HOSPITAL_COMMUNITY)
Admission: RE | Disposition: A | Discharge status: Home / Self Care | Payer: Self-pay | Source: Home / Self Care | Attending: General Surgery

## 2022-12-02 DIAGNOSIS — F17210 Nicotine dependence, cigarettes, uncomplicated: Secondary | ICD-10-CM | POA: Diagnosis not present

## 2022-12-02 DIAGNOSIS — R569 Unspecified convulsions: Secondary | ICD-10-CM | POA: Insufficient documentation

## 2022-12-02 DIAGNOSIS — R1032 Left lower quadrant pain: Secondary | ICD-10-CM | POA: Diagnosis not present

## 2022-12-02 DIAGNOSIS — K4090 Unilateral inguinal hernia, without obstruction or gangrene, not specified as recurrent: Secondary | ICD-10-CM | POA: Diagnosis not present

## 2022-12-02 DIAGNOSIS — R1030 Lower abdominal pain, unspecified: Secondary | ICD-10-CM | POA: Diagnosis not present

## 2022-12-02 DIAGNOSIS — R599 Enlarged lymph nodes, unspecified: Secondary | ICD-10-CM | POA: Diagnosis not present

## 2022-12-02 HISTORY — PX: INGUINAL LYMPH NODE BIOPSY: SHX5865

## 2022-12-02 HISTORY — PX: XI ROBOTIC ASSISTED INGUINAL HERNIA REPAIR WITH MESH: SHX6706

## 2022-12-02 SURGERY — REPAIR, HERNIA, INGUINAL, ROBOT-ASSISTED, LAPAROSCOPIC, USING MESH
Anesthesia: General | Laterality: Left

## 2022-12-02 MED ORDER — OXYCODONE HCL 5 MG/5ML PO SOLN: 5.0000 mg | Freq: Once | ORAL | Status: AC | PRN

## 2022-12-02 MED ORDER — BUPIVACAINE HCL (PF) 0.5 % IJ SOLN
INTRAMUSCULAR | Status: AC
  Filled 2022-12-02: qty 30

## 2022-12-02 MED ORDER — OXYCODONE HCL 5 MG PO TABS
5.0000 mg | ORAL_TABLET | Freq: Once | ORAL | Status: AC | PRN
  Administered 2022-12-02: 5 mg via ORAL
  Filled 2022-12-02: qty 1

## 2022-12-02 MED ORDER — ORAL CARE MOUTH RINSE: 15.0000 mL | Freq: Once | OROMUCOSAL | Status: DC

## 2022-12-02 MED ORDER — KETOROLAC TROMETHAMINE 30 MG/ML IJ SOLN
INTRAMUSCULAR | Status: AC
  Filled 2022-12-02: qty 1

## 2022-12-02 MED ORDER — ONDANSETRON HCL 4 MG/2ML IJ SOLN
INTRAMUSCULAR | Status: DC | PRN
  Administered 2022-12-02: 4 mg via INTRAVENOUS

## 2022-12-02 MED ORDER — KETOROLAC TROMETHAMINE 30 MG/ML IJ SOLN
INTRAMUSCULAR | Status: DC | PRN
  Administered 2022-12-02: 30 mg via INTRAVENOUS

## 2022-12-02 MED ORDER — EPHEDRINE SULFATE (PRESSORS) 50 MG/ML IJ SOLN
INTRAMUSCULAR | Status: DC | PRN
  Administered 2022-12-02: 5 mg via INTRAVENOUS

## 2022-12-02 MED ORDER — ONDANSETRON HCL 4 MG/2ML IJ SOLN: 4.0000 mg | Freq: Once | INTRAMUSCULAR | Status: DC | PRN

## 2022-12-02 MED ORDER — DEXMEDETOMIDINE HCL IN NACL 80 MCG/20ML IV SOLN
INTRAVENOUS | Status: DC | PRN
  Administered 2022-12-02 (×2): 10 ug via INTRAVENOUS

## 2022-12-02 MED ORDER — DEXAMETHASONE SODIUM PHOSPHATE 10 MG/ML IJ SOLN
INTRAMUSCULAR | Status: AC
  Filled 2022-12-02: qty 1

## 2022-12-02 MED ORDER — CALCIUM CHLORIDE 10 % IV SOLN
INTRAVENOUS | Status: AC
  Filled 2022-12-02: qty 10

## 2022-12-02 MED ORDER — MIDAZOLAM HCL 5 MG/5ML IJ SOLN
INTRAMUSCULAR | Status: DC | PRN
  Administered 2022-12-02: 2 mg via INTRAVENOUS

## 2022-12-02 MED ORDER — ONDANSETRON HCL 4 MG PO TABS: 4.0000 mg | ORAL_TABLET | Freq: Three times a day (TID) | ORAL | 1 refills | Status: DC | PRN

## 2022-12-02 MED ORDER — ROCURONIUM BROMIDE 10 MG/ML (PF) SYRINGE
PREFILLED_SYRINGE | INTRAVENOUS | Status: DC | PRN
  Administered 2022-12-02: 60 mg via INTRAVENOUS

## 2022-12-02 MED ORDER — FENTANYL CITRATE (PF) 100 MCG/2ML IJ SOLN
INTRAMUSCULAR | Status: DC | PRN
  Administered 2022-12-02: 50 ug via INTRAVENOUS

## 2022-12-02 MED ORDER — LACTATED RINGERS IV SOLN: INTRAVENOUS | Status: DC

## 2022-12-02 MED ORDER — PROPOFOL 10 MG/ML IV BOLUS
INTRAVENOUS | Status: DC | PRN
  Administered 2022-12-02: 160 mg via INTRAVENOUS

## 2022-12-02 MED ORDER — DEXAMETHASONE SODIUM PHOSPHATE 10 MG/ML IJ SOLN
INTRAMUSCULAR | Status: DC | PRN
  Administered 2022-12-02: 10 mg via INTRAVENOUS

## 2022-12-02 MED ORDER — CHLORHEXIDINE GLUCONATE CLOTH 2 % EX PADS
6.0000 | MEDICATED_PAD | Freq: Once | CUTANEOUS | Status: AC
  Administered 2022-12-02: 6 via TOPICAL

## 2022-12-02 MED ORDER — STERILE WATER FOR IRRIGATION IR SOLN
Status: DC | PRN
  Administered 2022-12-02: 500 mL

## 2022-12-02 MED ORDER — FENTANYL CITRATE PF 50 MCG/ML IJ SOSY
25.0000 ug | PREFILLED_SYRINGE | INTRAMUSCULAR | Status: DC | PRN
  Administered 2022-12-02 (×2): 50 ug via INTRAVENOUS
  Filled 2022-12-02 (×2): qty 1

## 2022-12-02 MED ORDER — CHLORHEXIDINE GLUCONATE 0.12 % MT SOLN: 15.0000 mL | Freq: Once | OROMUCOSAL | Status: DC

## 2022-12-02 MED ORDER — CEFAZOLIN SODIUM-DEXTROSE 2-4 GM/100ML-% IV SOLN
INTRAVENOUS | Status: AC
  Filled 2022-12-02: qty 100

## 2022-12-02 MED ORDER — ONDANSETRON HCL 4 MG/2ML IJ SOLN
INTRAMUSCULAR | Status: AC
Start: 2022-12-02 — End: ?
  Filled 2022-12-02: qty 2

## 2022-12-02 MED ORDER — BUPIVACAINE-EPINEPHRINE (PF) 0.25% -1:200000 IJ SOLN
INTRAMUSCULAR | Status: AC
  Filled 2022-12-02: qty 30

## 2022-12-02 MED ORDER — OXYCODONE HCL 5 MG PO TABS: 5.0000 mg | ORAL_TABLET | ORAL | 0 refills | Status: DC | PRN

## 2022-12-02 MED ORDER — SUCCINYLCHOLINE CHLORIDE 200 MG/10ML IV SOSY
PREFILLED_SYRINGE | INTRAVENOUS | Status: DC | PRN
  Administered 2022-12-02: 120 mg via INTRAVENOUS

## 2022-12-02 MED ORDER — LACTATED RINGERS IV SOLN
INTRAVENOUS | Status: DC | PRN
Start: 2022-12-02 — End: 2022-12-02

## 2022-12-02 MED ORDER — SUGAMMADEX SODIUM 200 MG/2ML IV SOLN
INTRAVENOUS | Status: DC | PRN
  Administered 2022-12-02: 200 mg via INTRAVENOUS

## 2022-12-02 MED ORDER — CEFAZOLIN SODIUM-DEXTROSE 2-4 GM/100ML-% IV SOLN
2.0000 g | INTRAVENOUS | Status: AC
  Administered 2022-12-02: 2 g via INTRAVENOUS

## 2022-12-02 MED ORDER — MIDAZOLAM HCL 2 MG/2ML IJ SOLN
INTRAMUSCULAR | Status: AC
  Filled 2022-12-02: qty 2

## 2022-12-02 MED ORDER — FENTANYL CITRATE (PF) 100 MCG/2ML IJ SOLN
INTRAMUSCULAR | Status: AC
  Filled 2022-12-02: qty 2

## 2022-12-02 MED ORDER — BUPIVACAINE HCL (PF) 0.5 % IJ SOLN
INTRAMUSCULAR | Status: DC | PRN
  Administered 2022-12-02: 30 mL

## 2022-12-02 SURGICAL SUPPLY — 65 items
ADH SKN CLS APL DERMABOND .7 (GAUZE/BANDAGES/DRESSINGS) ×2
APL PRP STRL LF DISP 70% ISPRP (MISCELLANEOUS) ×1
CHLORAPREP W/TINT 26 (MISCELLANEOUS) ×1 IMPLANT
CLOTH BEACON ORANGE TIMEOUT ST (SAFETY) ×1 IMPLANT
CNTNR URN SCR LID CUP LEK RST (MISCELLANEOUS) ×1 IMPLANT
CONT SPEC 4OZ STRL OR WHT (MISCELLANEOUS) ×1
COVER LIGHT HANDLE STERIS (MISCELLANEOUS) ×2 IMPLANT
COVER MAYO STAND XLG (MISCELLANEOUS) ×1 IMPLANT
COVER TIP SHEARS 8 DVNC (MISCELLANEOUS) ×1 IMPLANT
DECANTER SPIKE VIAL GLASS SM (MISCELLANEOUS) ×1 IMPLANT
DERMABOND ADVANCED .7 DNX12 (GAUZE/BANDAGES/DRESSINGS) ×1 IMPLANT
DRAPE 3/4 80X56 (DRAPES) ×1 IMPLANT
DRAPE ARM DVNC X/XI (DISPOSABLE) ×3 IMPLANT
DRAPE COLUMN DVNC XI (DISPOSABLE) ×1 IMPLANT
DRAPE HALF SHEET 40X57 (DRAPES) ×1 IMPLANT
DRIVER NDL MEGA SUTCUT DVNCXI (INSTRUMENTS) ×1 IMPLANT
DRIVER NDLE MEGA SUTCUT DVNCXI (INSTRUMENTS) ×1
ELECT REM PT RETURN 9FT ADLT (ELECTROSURGICAL) ×1
ELECTRODE REM PT RTRN 9FT ADLT (ELECTROSURGICAL) ×1 IMPLANT
FORCEPS BPLR R/ABLATION 8 DVNC (INSTRUMENTS) ×1 IMPLANT
GAUZE SPONGE 4X4 12PLY STRL (GAUZE/BANDAGES/DRESSINGS) ×1 IMPLANT
GLOVE BIO SURGEON STRL SZ 6.5 (GLOVE) ×2 IMPLANT
GLOVE BIOGEL PI IND STRL 6.5 (GLOVE) ×2 IMPLANT
GLOVE BIOGEL PI IND STRL 7.0 (GLOVE) ×3 IMPLANT
GOWN STRL REUS W/TWL LRG LVL3 (GOWN DISPOSABLE) ×2 IMPLANT
KIT PINK PAD W/HEAD ARE REST (MISCELLANEOUS) ×1
KIT PINK PAD W/HEAD ARM REST (MISCELLANEOUS) ×1 IMPLANT
KIT TURNOVER KIT A (KITS) ×1 IMPLANT
MANIFOLD NEPTUNE II (INSTRUMENTS) ×1 IMPLANT
MESH 3DMAX MID 4X6 LT LRG (Mesh General) ×1 IMPLANT
NDL HYPO 25X1 1.5 SAFETY (NEEDLE) ×1 IMPLANT
NDL INSUFFLATION 14GA 120MM (NEEDLE) ×1 IMPLANT
NEEDLE HYPO 22GX1.5 SAFETY (NEEDLE) ×1 IMPLANT
NEEDLE HYPO 25X1 1.5 SAFETY (NEEDLE) ×1
NEEDLE INSUFFLATION 14GA 120MM (NEEDLE) ×1
NS IRRIG 1000ML POUR BTL (IV SOLUTION) ×1 IMPLANT
OBTURATOR OPTICAL STND 8 DVNC (TROCAR) ×1
OBTURATOR OPTICALSTD 8 DVNC (TROCAR) ×1 IMPLANT
PACK LAP CHOLECYSTECTOMY (MISCELLANEOUS) ×1 IMPLANT
PACK MINOR (CUSTOM PROCEDURE TRAY) ×1 IMPLANT
PAD ARMBOARD 7.5X6 YLW CONV (MISCELLANEOUS) ×1 IMPLANT
PENCIL HANDSWITCHING (ELECTRODE) ×1 IMPLANT
POSITIONER HEAD 8X9X4 ADT (SOFTGOODS) ×1 IMPLANT
SCISSORS MNPLR CVD DVNC XI (INSTRUMENTS) ×1 IMPLANT
SEAL UNIV 5-12 XI (MISCELLANEOUS) ×3 IMPLANT
SET BASIN LINEN APH (SET/KITS/TRAYS/PACK) ×1 IMPLANT
SET TUBE SMOKE EVAC HIGH FLOW (TUBING) ×1 IMPLANT
SOL PREP POV-IOD 4OZ 10% (MISCELLANEOUS) ×1 IMPLANT
SPIKE FLUID TRANSFER (MISCELLANEOUS) IMPLANT
STAPLER CANNULA SEAL DVNC XI (STAPLE) ×1 IMPLANT
SUT MNCRL AB 4-0 PS2 18 (SUTURE) ×1 IMPLANT
SUT V-LOC 90 ABS 3-0 VLT V-20 (SUTURE) ×2 IMPLANT
SUT VIC AB 2-0 SH 27 (SUTURE) ×1
SUT VIC AB 2-0 SH 27X BRD (SUTURE) ×1 IMPLANT
SUT VIC AB 3-0 SH 27 (SUTURE) ×1
SUT VIC AB 3-0 SH 27X BRD (SUTURE) ×1 IMPLANT
SUT VICRYL 0 AB UR-6 (SUTURE) ×1 IMPLANT
SUT VICRYL AB 3 0 TIES (SUTURE) ×1 IMPLANT
SYR 30ML LL (SYRINGE) ×2 IMPLANT
SYR CONTROL 10ML LL (SYRINGE) ×1 IMPLANT
TAPE TRANSPORE STRL 2 31045 (GAUZE/BANDAGES/DRESSINGS) ×1 IMPLANT
TRAY FOL W/BAG SLVR 16FR STRL (SET/KITS/TRAYS/PACK) ×1 IMPLANT
TRAY FOLEY W/BAG SLVR 16FR LF (SET/KITS/TRAYS/PACK) ×1
WATER STERILE IRR 500ML POUR (IV SOLUTION) ×1 IMPLANT
YANKAUER SUCT BULB TIP 10FT TU (MISCELLANEOUS) IMPLANT

## 2022-12-02 NOTE — Discharge Instructions (Signed)
Discharge Robotic Assisted Laparoscopic Surgery Instructions:  Common Complaints: Right shoulder pain is common after laparoscopic surgery.  This is secondary to the gas used in the surgery being trapped under the diaphragm.  Walk to help your body absorb the gas. This will improve in a few days. Pain at the port sites are common, especially the larger port sites. This will improve with time.  Some nausea is common and poor appetite. The main goal is to stay hydrated the first few days after surgery.   Diet/ Activity: Diet as tolerated. You may not have an appetite, but it is important to stay hydrated.  Drink 64 ounces of water a day. Your appetite will return with time.  Shower per your regular routine daily.  Do not take hot showers. Take warm showers that are less than 10 minutes. Rest and listen to your body, but do not remain in bed all day.  Walk everyday for at least 15-20 minutes. Deep cough and move around every 1-2 hours in the first few days after surgery.  Do not lift > 10 lbs, perform excessive bending, pushing, pulling, squatting for 4weeks after surgery.  Do not pick at the dermabond glue on your incision sites.  This glue film will remain in place for 1-2 weeks and will start to peel off.  Do not place lotions or balms on your incision unless instructed to specifically by Dr. Constance Haw.   Pain Expectations and Narcotics: -After surgery you will have pain associated with your incisions and this is normal. The pain is muscular and nerve pain, and will get better with time. -You are encouraged and expected to take non narcotic medications like tylenol and ibuprofen (when able) to treat pain as multiple modalities can aid with pain treatment. -Narcotics are only used when pain is severe or there is breakthrough pain. -You are not expected to have a pain score of 0 after surgery, as we cannot prevent pain. A pain score of 3-4 that allows you to be functional, move, walk, and tolerate  some activity is the goal. The pain will continue to improve over the days after surgery and is dependent on your surgery. -Due to [place] law, we are only able to give a certain amount of pain medication to treat post operative pain, and we only give additional narcotics on a patient by patient basis.  -For most laparoscopic surgery, studies have shown that the majority of patients only need 10-15 narcotic pills, and for open surgeries most patients only need 15-20.   -Having appropriate expectations of pain and knowledge of pain management with non narcotics is important as we do not want anyone to become addicted to narcotic pain medication.  -Using ice packs in the first 48 hours and heating pads after 48 hours, wearing an abdominal binder (when recommended), and using over the counter medications are all ways to help with pain management.   -Simple acts like meditation and mindfulness practices after surgery can also help with pain control and research has proven the benefit of these practices.  Medication: Take tylenol and ibuprofen as needed for pain control, alternating every 4-6 hours.  Example:  Tylenol '1000mg'$  @ 6am, 12noon, 6pm, 10mdnight (Do not exceed '4000mg'$  of tylenol a day). Ibuprofen '800mg'$  @ 9am, 3pm, 9pm, 3am (Do not exceed '3600mg'$  of ibuprofen a day).  Take Roxicodone for breakthrough pain every 4 hours.  Take Colace for constipation related to narcotic pain medication. If you do not have a bowel movement in 2  days, take Miralax over the counter.  Drink plenty of water to also prevent constipation.   Contact Information: If you have questions or concerns, please call our office, [phone], Monday- Thursday 8AM-5PM and Friday 8AM-12Noon.  If it is after hours or on the weekend, please call [facility], [phone], [phone], and ask to speak to the surgeon on call for Dr. Constance Haw at [facility].

## 2022-12-02 NOTE — Transfer of Care (Signed)
Immediate Anesthesia Transfer of Care Note  Patient: Trevor Pacheco  Procedure(s) Performed: XI ROBOTIC ASSISTED INGUINAL HERNIA REPAIR WITH MESH (Left) INGUINAL LYMPH NODE BIOPSY X2 (Left)  Patient Location: PACU  Anesthesia Type:General  Level of Consciousness: drowsy  Airway & Oxygen Therapy: Patient Spontanous Breathing and Patient connected to face mask oxygen  Post-op Assessment: Report given to RN and Post -op Vital signs reviewed and stable  Post vital signs: Reviewed and stable  Last Vitals:  Vitals Value Taken Time  BP 111/83 12/02/22 1435  Temp 36.8 C 12/02/22 1435  Pulse 66 12/02/22 1437  Resp 31 12/02/22 1437  SpO2 100 % 12/02/22 1437  Vitals shown include unfiled device data.  Last Pain:  Vitals:   12/02/22 1119  TempSrc: Oral  PainSc: 5       Patients Stated Pain Goal: 8 (12/02/22 1119)  Complications: No notable events documented.

## 2022-12-02 NOTE — Progress Notes (Signed)
[facility]  Updated his sister. Rx to [employer]. Will see in 1 month.   No heavy lifting > 10 lbs, excessive bending, pushing, pulling, or squatting for 4 weeks after surgery.   Algis Greenhouse, MD [facility] [address] [place], [place] [address] [phone] (office)

## 2022-12-02 NOTE — Op Note (Signed)
[facility] Operative Note  12/02/22  Preoperative Diagnosis: Left inguinal Hernia and left groin pain with superficial adenopathy    Postoperative Diagnosis: Same   Procedure(s) Performed: Robotic assisted laparoscopic left inguinal hernia repair with mesh, excisional biopsy left inguinal lymph nodes    Surgeon: Leatrice Jewels. Henreitta Leber, MD   Assistants: No qualified resident was available    Anesthesia: General endotracheal   Anesthesiologist: Windell Norfolk, MD    Specimens: Left nodes X 2   Estimated Blood Loss: Minimal   Blood Replacement: None    Complications: None   Wound Class:Clean   Operative Indications: The patient has a left inguinal hernia that is symptomatic and they want repaired. We discussed robotic assisted laparoscopic inguinal hernia repair and risk of bleeding, infection, issues with chronic pain post operatively, use of mesh, risk of recurrence, chance of needing to repair a bilateral hernia, risk of injury to bowel or bladder, and risk of injury to cord structures for male patients.  He also had these superficial lymph nodes that he felt were enlarged and also causing him pain. On exam he was tender over the area and they felt likely right at 1.5cm or larger, we discussed excision and risk of bleeding, infection, lymph leak, finding something unexpected like cancer.   Findings: Superficial lymph nodes, 1cm node and >1.5cm node excised  Vas Deferens and cord structures identified and preserved Bard 3D Max Medium Weight Mesh, Large size Hemostasis achieved   Procedure: The patient was taken to the operating room and placed supine. General endotracheal anesthesia was induced. Intravenous antibiotics were  administered per protocol.  A foley catheter was placed and a orogastric tube positioned to decompress the stomach. The abdomen and groin were prepared and draped in the usual sterile fashion.   An incision was made over  the lymph nodes  which were superficial and this was carried down to the subcutaneous tissue. I could palpated the nodes easily in the subcutaneous tissue. Using cautery, excised two lymph nodes and sent to the to pathology fresh. The incision was closed with 3-0 interrupted Vicryl and 4-0 Monocryl subcuticular.   An incision was marked 20 cm above the pubic tubercle, slightly above the umbilicus. Veress needle inserted at palmer's point.  Saline drop test noted to be positive with gradual increase in pressure after initiation of gas insufflation.  15 mm of pressure was achieved prior to removing the Veress needle and then placing a 8 mm port via the Optiview technique through the supraumbilical site that had been previously marked.  Inspection of the area afterwards noted no injury to the surrounding organs during insertion of the needle and the port.  2 port sites were marked 8 cm to the lateral sides of the initial port, and a 8 mm robotic port was placed on the left side, another 8 mm robotic port on the right side under direct supervision. The [employer] platform was then brought into the operative field and docked to the ports.  Examination of the abdominal cavity noted a very small left inguinal hernia.  A peritoneal flap was created approximately 5cm cephalad to the defect by using scissors with electrocautery.  Dissection was carried down towards the pubic tubercle, developing the myopectineal orifice view. Laterally the flap was carried towards the ASIS.  An indirect hernia sac was noted, which carefully dissected away from the adjacent tissues to be fully reduced out of hernia cavity.  Any bleeding was controlled with combination of electrocautery and  manual pressure.   After confirming adequate dissection and the peritoneal reflection completely down and away from the cord structures the divided round ligament at the deep ring, a large Bard 3DMax Medium weight mesh was placed within the anterior abdominal wall and  secured in place using 2-0 Vicryl immediately above the pubic tubercle and superior on the abdominal wall ensuring no vessels or nerves were incorporated.  After noting proper placement of the mesh with the peritoneal reflection deep to it, the previously created peritoneal flap was secured back up to the anterior abdominal wall using running 3-0 V-Lock.  All needles were then removed out of the abdominal cavity, Xi platform undocked from the ports and removed off of operative field.  Exparel mixed with marcaine was infused as ilioinguinal block and at the port sites.   The abdomen was then desufflated and ports removed. All skin incisions were closed with a subcuticular stitch of Monocryl 4-0. Dermabond was applied on the port sites and on the left groin incision site. Marcaine was injected in a block in the left inguinal region and in the port sites.  The testis was gently pulled down into its anatomic position in the scrotum.  Final inspection revealed acceptable hemostasis. All counts were correct at the end of the case. The patient was awakened from anesthesia and extubated without complication.  The patient went to the PACU in stable condition.   Algis Greenhouse, MD [facility] [address] [place], [place] [address] [phone] (office)

## 2022-12-02 NOTE — Anesthesia Preprocedure Evaluation (Signed)
Anesthesia Evaluation  Patient identified by MRN, date of birth, ID band Patient awake    Reviewed: Allergy & Precautions, H&P , NPO status , Patient's Chart, lab work & pertinent test results, reviewed documented beta blocker date and time   Airway Mallampati: II  TM Distance: >3 FB Neck ROM: full    Dental no notable dental hx.    Pulmonary neg pulmonary ROS, Current Smoker   Pulmonary exam normal breath sounds clear to auscultation       Cardiovascular Exercise Tolerance: Good negative cardio ROS + Valvular Problems/Murmurs  Rhythm:regular Rate:Normal     Neuro/Psych Seizures -,  PSYCHIATRIC DISORDERS Anxiety     negative neurological ROS  negative psych ROS   GI/Hepatic negative GI ROS, Neg liver ROS,,,  Endo/Other  negative endocrine ROS    Renal/GU negative Renal ROS  negative genitourinary   Musculoskeletal   Abdominal   Peds  Hematology negative hematology ROS (+)   Anesthesia Other Findings   Reproductive/Obstetrics negative OB ROS                             Anesthesia Physical Anesthesia Plan  ASA: 2  Anesthesia Plan: General   Post-op Pain Management:    Induction:   PONV Risk Score and Plan: Propofol infusion  Airway Management Planned:   Additional Equipment:   Intra-op Plan:   Post-operative Plan:   Informed Consent: I have reviewed the patients History and Physical, chart, labs and discussed the procedure including the risks, benefits and alternatives for the proposed anesthesia with the patient or authorized representative who has indicated his/her understanding and acceptance.     Dental Advisory Given  Plan Discussed with: CRNA  Anesthesia Plan Comments:        Anesthesia Quick Evaluation

## 2022-12-02 NOTE — Anesthesia Procedure Notes (Signed)
Procedure Name: Intubation Date/Time: 12/02/2022 12:43 PM  Performed by: Lendon Ka, CRNAPre-anesthesia Checklist: Patient identified, Emergency Drugs available, Suction available and Patient being monitored Patient Re-evaluated:Patient Re-evaluated prior to induction Oxygen Delivery Method: Circle System Utilized Preoxygenation: Pre-oxygenation with 100% oxygen Induction Type: IV induction Ventilation: Mask ventilation without difficulty Laryngoscope Size: 3 and Mac Grade View: Grade I Tube type: Oral Tube size: 7.5 mm Number of attempts: 1 Placement Confirmation: ETT inserted through vocal cords under direct vision, positive ETCO2 and breath sounds checked- equal and bilateral Secured at: 22 cm Tube secured with: Tape Dental Injury: Teeth and Oropharynx as per pre-operative assessment  Comments: With ease x1

## 2022-12-02 NOTE — Interval H&P Note (Signed)
History and Physical Interval Note:  12/02/2022 12:31 PM  Trevor Pacheco  has presented today for surgery, with the diagnosis of HERNIA, INGUINAL LEFT GROIN PAIN , LEFT.  The various methods of treatment have been discussed with the patient and family. After consideration of risks, benefits and other options for treatment, the patient has consented to  Procedure(s): XI ROBOTIC ASSISTED INGUINAL HERNIA REPAIR WITH MESH (Left) INGUINAL LYMPH NODE BIOPSY X2 (Left) as a surgical intervention.  The patient's history has been reviewed, patient examined, no change in status, stable for surgery.  I have reviewed the patient's chart and labs.  Questions were answered to the patient's satisfaction.     Lucretia Roers

## 2022-12-03 ENCOUNTER — Telehealth: Payer: Self-pay | Admitting: *Deleted

## 2022-12-03 LAB — SURGICAL PATHOLOGY

## 2022-12-03 MED ORDER — IBUPROFEN 800 MG PO TABS: 800.0000 mg | ORAL_TABLET | Freq: Three times a day (TID) | ORAL | 0 refills | Status: DC | PRN

## 2022-12-03 NOTE — Progress Notes (Signed)
Let him know the nodes were benign.

## 2022-12-03 NOTE — Telephone Encounter (Signed)
Contacted patient. Notified patient. Patient verbalized understanding 

## 2022-12-03 NOTE — Telephone Encounter (Signed)
Copied from CRM [id number]. Topic: Clinical - Medication Refill >> Dec 03, 2022  2:29 PM Almira Coaster wrote: Most Recent Primary Care Visit:  Provider: Neale Burly CAPPS  Department: [facility]  Visit Type: OFFICE VISIT  Date: 10/23/2022  Medication: ibuprofen (ADVIL) 800 MG tablet  Has the patient contacted their pharmacy? No (Agent: If no, request that the patient contact the pharmacy for the refill. If patient does not wish to contact the pharmacy document the reason why and proceed with request.) (Agent: If yes, when and what did the pharmacy advise?)  Is this the correct pharmacy for this prescription? Yes   This is the patient's preferred pharmacy: No  [id number] [place] [place] - [address] [place] [address] Phone: [phone] Fax: [phone]   Has the prescription been filled recently? No  Is the patient out of the medication? No  Has the patient been seen for an appointment in the last year OR does the patient have an upcoming appointment? Yes  Can we respond through MyChart? No  Agent: Please be advised that Rx refills may take up to 3 business days. We ask that you follow-up with your pharmacy.

## 2022-12-04 ENCOUNTER — Telehealth: Payer: Self-pay | Admitting: *Deleted

## 2022-12-04 ENCOUNTER — Encounter (HOSPITAL_COMMUNITY): Payer: Self-pay | Admitting: General Surgery

## 2022-12-04 ENCOUNTER — Encounter: Payer: Self-pay | Admitting: *Deleted

## 2022-12-04 NOTE — Telephone Encounter (Signed)
Received call from patient [phone]~ telephone.   Reports that he would like to have letter stating that he is unable to work faxed to [facility]. States that he obtained fax numbers from DSS Website as follows: [phone], [phone], and [phone].  Unable to verify name or number at this time. Advised patient that if he is unsure of the number or who will be receiving fax, we are unable to send due to HIPAA.   Mailed letter to patient for his records.

## 2022-12-05 ENCOUNTER — Other Ambulatory Visit: Payer: Self-pay | Admitting: Family Medicine

## 2022-12-05 ENCOUNTER — Other Ambulatory Visit: Payer: Self-pay | Admitting: *Deleted

## 2022-12-05 NOTE — Telephone Encounter (Signed)
Surgical Date: 12/02/2022 Procedure: XI ROBOTIC ASSISTED INGUINAL HERNIA REPAIR WITH MESH, LEFT- INGUINAL LYMPH NODE BIOPSY X2, LEFT  Received call from patient [phone]~ telephone.   Patient requested refill on Oxycodone [facility]. Reports that he has #4 tablets remaining.   Patient states that pain is localized in left groin. States that he is using APAP/ IBU as directed. Reports that he is also using ice to reduce inflammation.   States that he is taking approximately 3- 4 tabs of Oxycodone per day.   Last prescription given by Dr. Henreitta Leber on 12/02/2022, # 15 tabs.   Ok to refill?

## 2022-12-06 NOTE — Anesthesia Postprocedure Evaluation (Signed)
Anesthesia Post Note  Patient: Trevor Pacheco  Procedure(s) Performed: XI ROBOTIC ASSISTED INGUINAL HERNIA REPAIR WITH MESH (Left) INGUINAL LYMPH NODE BIOPSY X2 (Left)  Patient location during evaluation: Phase II Anesthesia Type: General Level of consciousness: awake Pain management: pain level controlled Vital Signs Assessment: post-procedure vital signs reviewed and stable Respiratory status: spontaneous breathing and respiratory function stable Cardiovascular status: blood pressure returned to baseline and stable Postop Assessment: no headache and no apparent nausea or vomiting Anesthetic complications: no Comments: Late entry   No notable events documented.   Last Vitals:  Vitals:   12/02/22 1530 12/02/22 1542  BP: 108/60 105/65  Pulse: 76 (!) 56  Resp: 10 16  Temp:  (!) 36.1 C  SpO2: 100% 100%    Last Pain:  Vitals:   12/03/22 1616  TempSrc:   PainSc: 3                  Windell Norfolk

## 2022-12-08 MED ORDER — OXYCODONE HCL 5 MG PO TABS: 5.0000 mg | ORAL_TABLET | ORAL | 0 refills | Status: DC | PRN

## 2022-12-08 NOTE — Telephone Encounter (Signed)
One time refill

## 2022-12-12 ENCOUNTER — Telehealth (INDEPENDENT_AMBULATORY_CARE_PROVIDER_SITE_OTHER): Payer: Medicaid Other | Admitting: *Deleted

## 2022-12-12 DIAGNOSIS — K4090 Unilateral inguinal hernia, without obstruction or gangrene, not specified as recurrent: Secondary | ICD-10-CM

## 2022-12-12 DIAGNOSIS — R1032 Left lower quadrant pain: Secondary | ICD-10-CM

## 2022-12-12 MED ORDER — OXYCODONE HCL 5 MG PO TABS: 5.0000 mg | ORAL_TABLET | ORAL | 0 refills | Status: DC | PRN

## 2022-12-12 NOTE — Telephone Encounter (Signed)
Surgical Date: 12/02/2022 Procedure: XI ROBOTIC ASSISTED LEFT INGUINAL HERNIA REPAIR WITH MESH, LEFT INGUINAL LYMPH NODE BIOPSY X2   Received call from patient [phone]~ telephone.   Patient reports that he has increased swelling and pain at lymph node excision site. States that he has been using OTC IBU as instructed. Reports that he has been using ice and heat to area with minimal relief.   States that he is currently out of Oxycodone. Last prescription given on 12/08/2022, #15 tabs.   Post op appointment is scheduled for 12/31/2022.  Please advise.

## 2022-12-12 NOTE — Telephone Encounter (Signed)
[facility]  Will give him a refill for 5 more roxicodone 5mg . No further narcotics will be given.  Algis Greenhouse, MD [facility] [address] [place], [place] [address] [phone] (office)

## 2022-12-15 ENCOUNTER — Telehealth: Payer: Self-pay | Admitting: *Deleted

## 2022-12-15 NOTE — Telephone Encounter (Signed)
Surgical Date: 12/02/2022 Procedure: XI ROBOTIC ASSISTED LEFT INGUINAL HERNIA REPAIR WITH MESH, LEFT INGUINAL LYMPH NODE BIOPSY X2   Received MyChart message on appointment desk with Google Picture [email or web address]   Dr. Henreitta Leber reviewed photo and recommendations are as follows:  Does look like a seroma. This will go away after a few weeks but if causing him discomfort can aspirate it. It not causing issues tell him it should absorb itself but will take a while 6weeks+.  Lillia Abed   Call placed to patient and made aware or provider recommendations. States that he would like provider to examine and aspirate if possible.   Appointment scheduled.

## 2022-12-16 ENCOUNTER — Encounter: Payer: Self-pay | Admitting: General Surgery

## 2022-12-16 ENCOUNTER — Ambulatory Visit (INDEPENDENT_AMBULATORY_CARE_PROVIDER_SITE_OTHER): Payer: Medicaid Other | Admitting: General Surgery

## 2022-12-16 VITALS — BP 106/70 | HR 58 | Temp 98.1°F | Resp 16 | Ht 73.0 in | Wt 150.0 lb

## 2022-12-16 DIAGNOSIS — K4090 Unilateral inguinal hernia, without obstruction or gangrene, not specified as recurrent: Secondary | ICD-10-CM | POA: Diagnosis not present

## 2022-12-16 DIAGNOSIS — L7634 Postprocedural seroma of skin and subcutaneous tissue following other procedure: Secondary | ICD-10-CM | POA: Diagnosis not present

## 2022-12-16 DIAGNOSIS — R1032 Left lower quadrant pain: Secondary | ICD-10-CM | POA: Diagnosis not present

## 2022-12-16 NOTE — Progress Notes (Unsigned)
[facility]  Future Appointments  Date Time Provider Department Center  12/31/2022  1:45 PM Lucretia Roers, MD [facility] None  01/07/2023  9:30 AM Levert Feinstein, MD [facility] None  04/27/2023  8:30 AM Butch Penny, NP [facility] None

## 2022-12-16 NOTE — Patient Instructions (Signed)
Keep pressure dressing on for 72 hours. Seroma may reform but will be smaller. The body will reabsorb the area.

## 2022-12-23 DIAGNOSIS — F4310 Post-traumatic stress disorder, unspecified: Secondary | ICD-10-CM | POA: Diagnosis not present

## 2022-12-23 DIAGNOSIS — F410 Panic disorder [episodic paroxysmal anxiety] without agoraphobia: Secondary | ICD-10-CM | POA: Diagnosis not present

## 2022-12-23 DIAGNOSIS — F329 Major depressive disorder, single episode, unspecified: Secondary | ICD-10-CM | POA: Diagnosis not present

## 2022-12-23 NOTE — Telephone Encounter (Signed)
Received VM from patient after office hours on 12/22/2022.   Patient requested that letter stating he is out of work be faxed to [facility]  for his main case worker for food stamp eligibility at [phone]~ fax.   Call placed to patient to verify the name of the caseworker. No answer, no VM.   Letter printed and awaiting patient return call.

## 2022-12-31 ENCOUNTER — Encounter: Payer: Self-pay | Admitting: General Surgery

## 2022-12-31 ENCOUNTER — Ambulatory Visit (INDEPENDENT_AMBULATORY_CARE_PROVIDER_SITE_OTHER): Payer: Medicaid Other | Admitting: General Surgery

## 2022-12-31 VITALS — BP 104/66 | HR 61 | Temp 97.7°F | Resp 16 | Ht 73.0 in | Wt 154.0 lb

## 2022-12-31 DIAGNOSIS — L7634 Postprocedural seroma of skin and subcutaneous tissue following other procedure: Secondary | ICD-10-CM

## 2022-12-31 DIAGNOSIS — K4090 Unilateral inguinal hernia, without obstruction or gangrene, not specified as recurrent: Secondary | ICD-10-CM

## 2022-12-31 DIAGNOSIS — R1032 Left lower quadrant pain: Secondary | ICD-10-CM

## 2023-01-01 ENCOUNTER — Encounter: Payer: Self-pay | Admitting: General Surgery

## 2023-01-01 NOTE — Patient Instructions (Signed)
Diet and activity as tolerated Call with issues Pulling and tugging with movement and laying back will improve with time, related to the scarring in of the mesh

## 2023-01-01 NOTE — Progress Notes (Signed)
[facility]  Says he gets some pulling and tugging pain in the right side when he lays back but otherwise good. The seroma area is smaller in size.  BP 104/66   Pulse 61   Temp 97.7 F (36.5 C) (Oral)   Resp 16   Ht 6\' 1"  (1.854 m)   Wt 154 lb (69.9 kg)   SpO2 97%   BMI 20.32 kg/m  Port site healing, seroma site decompressed from lymph node excision  Patient s/p robotic assisted left inguinal hernia repair with mesh and lymph node excision. Seroma aspiration. Doing well.  Diet and activity as tolerated Call with issues Pulling and tugging with movement and laying back will improve with time, related to the scarring in of the mesh   Algis Greenhouse, MD [facility] [address] [place], [place] [address] [phone] (office)

## 2023-01-06 ENCOUNTER — Telehealth: Payer: Self-pay | Admitting: Adult Health

## 2023-01-06 NOTE — Telephone Encounter (Signed)
Pt said last 3 months have been some pressure at the back of head. Asking if can increase lamoTRIgine (LAMICTAL) 25 MG tablet to 50 mg. Could not come to appt tomorrow due to transportation issues reschedule  for 04/20/23. Would like a call back.

## 2023-01-06 NOTE — Telephone Encounter (Signed)
Ok to increase just caution him about potential side effects.

## 2023-01-06 NOTE — Telephone Encounter (Signed)
I called pt and he has had 3 episodes in the last 2 months.  Feeling like he will have sz like/episode, pressure back in head.  He cannot tell me how low he has but stated feels like an eternity to him.  No alcohol, no recreational drugs, no new medications, is not sleep deprived.  He has been compliant with his medications lamotrigine 125mg  po bid .   He is not able to make appt with Dr.Yan tomorrow due to transportation.  He is asking for an increase of 25mg  bid in his dose.  (To make 150mg  po bid).  [phone]  He has appt in March 2025 with you and Dr. Terrace Arabia.  (Will have to cancel one), will let him know when call back about his med change request.

## 2023-01-07 ENCOUNTER — Institutional Professional Consult (permissible substitution): Payer: Medicaid Other | Admitting: Neurology

## 2023-01-07 MED ORDER — LAMOTRIGINE 25 MG PO TABS: ORAL_TABLET | ORAL | 5 refills | Status: DC

## 2023-01-07 MED ORDER — LAMOTRIGINE 100 MG PO TABS: 100.0000 mg | ORAL_TABLET | Freq: Two times a day (BID) | ORAL | 1 refills | Status: DC

## 2023-01-07 NOTE — Telephone Encounter (Signed)
I called pt and let him know that MM/NP ok to increase the lamotrigine to 150mg  po bid.  Explained to take 100mg  tabs po bid and the 25mg  tabs (take 2 tabs po bid). Increase from 125mg  po bid to 150mg  po bid.  Pt verbalized understanding and did repeat instructions. Appt cancelled with MM/NP since had appt with Dr. Terrace Arabia in March 2025.  Pt agreeable.  He will call back as needed.  He is aware of watching for any new side effects.

## 2023-01-08 ENCOUNTER — Telehealth: Payer: Self-pay | Admitting: Family Medicine

## 2023-01-08 ENCOUNTER — Other Ambulatory Visit: Payer: Self-pay | Admitting: Family Medicine

## 2023-01-08 DIAGNOSIS — M542 Cervicalgia: Secondary | ICD-10-CM

## 2023-01-08 MED ORDER — CELECOXIB 100 MG PO CAPS: 100.0000 mg | ORAL_CAPSULE | Freq: Two times a day (BID) | ORAL | 0 refills | Status: DC

## 2023-01-08 NOTE — Telephone Encounter (Signed)
Contacted patient. Notified patient. Patient verbalized understanding 

## 2023-01-08 NOTE — Telephone Encounter (Signed)
Would like to switch patient to celebrex over ibuprofen to reduce GI risks with chronic use of NSAIDs. Please stop ibuprofen. Sent in 100 mg twice daily celebrex for pt.

## 2023-01-09 DIAGNOSIS — M542 Cervicalgia: Secondary | ICD-10-CM | POA: Diagnosis not present

## 2023-01-09 DIAGNOSIS — M4802 Spinal stenosis, cervical region: Secondary | ICD-10-CM | POA: Diagnosis not present

## 2023-01-09 DIAGNOSIS — M7918 Myalgia, other site: Secondary | ICD-10-CM | POA: Diagnosis not present

## 2023-01-09 DIAGNOSIS — G8929 Other chronic pain: Secondary | ICD-10-CM | POA: Diagnosis not present

## 2023-01-22 DIAGNOSIS — M7918 Myalgia, other site: Secondary | ICD-10-CM | POA: Diagnosis not present

## 2023-01-30 ENCOUNTER — Ambulatory Visit (INDEPENDENT_AMBULATORY_CARE_PROVIDER_SITE_OTHER): Payer: Medicaid Other | Admitting: *Deleted

## 2023-01-30 DIAGNOSIS — Z23 Encounter for immunization: Secondary | ICD-10-CM | POA: Diagnosis not present

## 2023-01-30 NOTE — Progress Notes (Signed)
 Patient walked into office today requesting flu vaccine. Vaccine given and tolerated well.

## 2023-02-16 DIAGNOSIS — M542 Cervicalgia: Secondary | ICD-10-CM | POA: Diagnosis not present

## 2023-02-16 DIAGNOSIS — M4802 Spinal stenosis, cervical region: Secondary | ICD-10-CM | POA: Diagnosis not present

## 2023-02-16 DIAGNOSIS — M7918 Myalgia, other site: Secondary | ICD-10-CM | POA: Diagnosis not present

## 2023-02-16 DIAGNOSIS — G8929 Other chronic pain: Secondary | ICD-10-CM | POA: Diagnosis not present

## 2023-02-24 DIAGNOSIS — F4310 Post-traumatic stress disorder, unspecified: Secondary | ICD-10-CM | POA: Diagnosis not present

## 2023-02-24 DIAGNOSIS — F329 Major depressive disorder, single episode, unspecified: Secondary | ICD-10-CM | POA: Diagnosis not present

## 2023-02-24 DIAGNOSIS — F410 Panic disorder [episodic paroxysmal anxiety] without agoraphobia: Secondary | ICD-10-CM | POA: Diagnosis not present

## 2023-03-09 ENCOUNTER — Other Ambulatory Visit: Payer: Self-pay | Admitting: Family Medicine

## 2023-03-09 DIAGNOSIS — M542 Cervicalgia: Secondary | ICD-10-CM

## 2023-03-10 ENCOUNTER — Ambulatory Visit: Payer: Medicaid Other | Admitting: General Surgery

## 2023-03-17 DIAGNOSIS — M542 Cervicalgia: Secondary | ICD-10-CM | POA: Diagnosis not present

## 2023-03-17 DIAGNOSIS — M7918 Myalgia, other site: Secondary | ICD-10-CM | POA: Diagnosis not present

## 2023-03-17 DIAGNOSIS — G8929 Other chronic pain: Secondary | ICD-10-CM | POA: Diagnosis not present

## 2023-03-17 DIAGNOSIS — M4802 Spinal stenosis, cervical region: Secondary | ICD-10-CM | POA: Diagnosis not present

## 2023-03-19 ENCOUNTER — Ambulatory Visit: Payer: Medicaid Other | Admitting: General Surgery

## 2023-03-25 ENCOUNTER — Ambulatory Visit (INDEPENDENT_AMBULATORY_CARE_PROVIDER_SITE_OTHER): Payer: Medicaid Other | Admitting: General Surgery

## 2023-03-25 ENCOUNTER — Encounter: Payer: Self-pay | Admitting: General Surgery

## 2023-03-25 VITALS — BP 110/74 | HR 67 | Temp 98.2°F | Resp 16 | Ht 73.0 in | Wt 174.0 lb

## 2023-03-25 DIAGNOSIS — R1032 Left lower quadrant pain: Secondary | ICD-10-CM | POA: Diagnosis not present

## 2023-03-25 NOTE — Progress Notes (Signed)
 [facility]  Patient reporting that he has pain in the left lower abdomen. He says he felt a pop in the LLQ in January.   BP 110/74   Pulse 67   Temp 98.2 F (36.8 C) (Oral)   Resp 16   Ht 6\' 1"  (1.854 m)   Wt 174 lb (78.9 kg)   SpO2 96%   BMI 22.96 kg/m  Left lower abdomen, 6inches above the inguinal region, no palpable hernia or mass, he says he feels a lump, I cannot appreciate anything  Port sites healed, inguinal nodes sites healed, no seroma remaining No obvious recurrent inguinal hernia  Patient with LLQ pain in the left mid abdomen that is well above the inguinal region. He has no obvious inguinal recurrence. I do not feel anything in this area.  Will get [place] to look at the area and you can show them exactly where it is bothering you. Will start with the [place] to see if anything concerning. Will call with results Can use over the counter lidocaine patches in the area as needed for now for pain control.   PRN Follow up   Algis Greenhouse, MD [facility] [address] [place], [place] [address] [phone] (office)

## 2023-03-25 NOTE — Patient Instructions (Addendum)
 Will get [place] to look at the area and you can show them exactly where it is bothering you. Will start with the [place] to see if anything concerning. Will call with results Can use over the counter lidocaine patches in the area as needed for now for pain control.

## 2023-04-01 ENCOUNTER — Ambulatory Visit (HOSPITAL_COMMUNITY)
Admission: RE | Admit: 2023-04-01 | Discharge: 2023-04-01 | Disposition: A | Discharge status: Home / Self Care | Payer: Medicaid Other | Source: Ambulatory Visit | Attending: General Surgery | Admitting: General Surgery

## 2023-04-01 DIAGNOSIS — R1032 Left lower quadrant pain: Secondary | ICD-10-CM | POA: Diagnosis not present

## 2023-04-01 DIAGNOSIS — R102 Pelvic and perineal pain: Secondary | ICD-10-CM | POA: Diagnosis not present

## 2023-04-15 NOTE — Progress Notes (Signed)
 Let patient know -No etiology for the pain in the left groin. Recommend patient monitor for worsening pain but should get better overtime. He could try gentle stretching or strengthening exercises.

## 2023-04-17 ENCOUNTER — Encounter: Payer: Self-pay | Admitting: *Deleted

## 2023-04-17 ENCOUNTER — Ambulatory Visit: Payer: Self-pay | Admitting: Family Medicine

## 2023-04-17 DIAGNOSIS — F4310 Post-traumatic stress disorder, unspecified: Secondary | ICD-10-CM | POA: Diagnosis not present

## 2023-04-17 DIAGNOSIS — F329 Major depressive disorder, single episode, unspecified: Secondary | ICD-10-CM | POA: Diagnosis not present

## 2023-04-17 DIAGNOSIS — F410 Panic disorder [episodic paroxysmal anxiety] without agoraphobia: Secondary | ICD-10-CM | POA: Diagnosis not present

## 2023-04-17 NOTE — Telephone Encounter (Signed)
 Apppt scheduled for Monday
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
## 2023-04-20 ENCOUNTER — Ambulatory Visit (HOSPITAL_COMMUNITY)
Admission: RE | Admit: 2023-04-20 | Discharge: 2023-04-20 | Disposition: A | Discharge status: Home / Self Care | Source: Ambulatory Visit | Attending: Family Medicine | Admitting: Family Medicine

## 2023-04-20 ENCOUNTER — Encounter: Payer: Self-pay | Admitting: Family Medicine

## 2023-04-20 ENCOUNTER — Ambulatory Visit (INDEPENDENT_AMBULATORY_CARE_PROVIDER_SITE_OTHER): Payer: Medicaid Other | Admitting: Neurology

## 2023-04-20 ENCOUNTER — Ambulatory Visit (INDEPENDENT_AMBULATORY_CARE_PROVIDER_SITE_OTHER): Admitting: Family Medicine

## 2023-04-20 ENCOUNTER — Encounter (HOSPITAL_COMMUNITY): Payer: Self-pay

## 2023-04-20 VITALS — BP 115/78 | HR 78 | Ht 72.0 in | Wt 178.0 lb

## 2023-04-20 VITALS — BP 109/72 | HR 68 | Ht 72.0 in | Wt 178.0 lb

## 2023-04-20 DIAGNOSIS — M549 Dorsalgia, unspecified: Secondary | ICD-10-CM | POA: Diagnosis not present

## 2023-04-20 DIAGNOSIS — K760 Fatty (change of) liver, not elsewhere classified: Secondary | ICD-10-CM | POA: Diagnosis not present

## 2023-04-20 DIAGNOSIS — G8929 Other chronic pain: Secondary | ICD-10-CM | POA: Diagnosis not present

## 2023-04-20 DIAGNOSIS — R410 Disorientation, unspecified: Secondary | ICD-10-CM

## 2023-04-20 DIAGNOSIS — R413 Other amnesia: Secondary | ICD-10-CM | POA: Diagnosis not present

## 2023-04-20 DIAGNOSIS — R1032 Left lower quadrant pain: Secondary | ICD-10-CM

## 2023-04-20 DIAGNOSIS — M542 Cervicalgia: Secondary | ICD-10-CM | POA: Diagnosis not present

## 2023-04-20 DIAGNOSIS — K429 Umbilical hernia without obstruction or gangrene: Secondary | ICD-10-CM | POA: Diagnosis not present

## 2023-04-20 MED ORDER — IOHEXOL 9 MG/ML PO SOLN
ORAL | Status: AC
  Filled 2023-04-20: qty 1000

## 2023-04-20 MED ORDER — LAMOTRIGINE 25 MG PO TABS: 50.0000 mg | ORAL_TABLET | Freq: Two times a day (BID) | ORAL | 11 refills | Status: AC

## 2023-04-20 MED ORDER — IOHEXOL 300 MG/ML  SOLN
100.0000 mL | Freq: Once | INTRAMUSCULAR | Status: AC | PRN
  Administered 2023-04-20: 100 mL via INTRAVENOUS

## 2023-04-20 MED ORDER — IOHEXOL 9 MG/ML PO SOLN
500.0000 mL | ORAL | Status: AC
Start: 2023-04-20 — End: 2023-04-20
  Administered 2023-04-20: 500 mL via ORAL

## 2023-04-20 MED ORDER — LAMOTRIGINE 100 MG PO TABS: 100.0000 mg | ORAL_TABLET | Freq: Two times a day (BID) | ORAL | 11 refills | Status: AC

## 2023-04-20 NOTE — Progress Notes (Signed)
 BP 115/78   Pulse 78   Ht 6' (1.829 m)   Wt 178 lb (80.7 kg)   SpO2 96%   BMI 24.14 kg/m    Subjective:   Patient ID: Trevor Pacheco, male    DOB: 1985-07-25, 38 y.o.   MRN: [id number]  HPI: Trevor Pacheco is a 38 y.o. male presenting on 04/20/2023 for Abdominal Pain (LLQ. Started in Jan. Has u/s at [facility] 3/5 per pt.)   HPI Left lower quadrant abdominal pain Patient said he started with left lower quadrant abdominal pain over the past couple months.  He did have a hernia surgery about 4 5 months ago but then a couple months ago he feels like he felt something pop down there and he started having the pain again.  He has had an ultrasound since then which did not show any abnormalities.  He says the pain is continued and it can be quite severe.  It is there most of the time.  He denies any constipation or blood in his stool.  He denies any urinary symptoms.  He denies any fevers or chills.  He says the pain is not right where his hernia was but higher than where his hernia was.  Relevant past medical, surgical, family and social history reviewed and updated as indicated. Interim medical history since our last visit reviewed. Allergies and medications reviewed and updated.  Review of Systems  Constitutional:  Negative for chills and fever.  Eyes:  Negative for visual disturbance.  Respiratory:  Negative for shortness of breath and wheezing.   Cardiovascular:  Negative for chest pain and leg swelling.  Gastrointestinal:  Positive for abdominal pain. Negative for nausea and vomiting.  Musculoskeletal:  Negative for back pain and gait problem.  Skin:  Negative for rash.  Neurological:  Negative for dizziness, light-headedness and numbness.  All other systems reviewed and are negative.   Per HPI unless specifically indicated above   Allergies as of 04/20/2023       Reactions   Clindamycin/lincomycin Other (See Comments)   ?  Pt says it caused an infection in his throat)    Penicillins Nausea And Vomiting        Medication List        Accurate as of April 20, 2023  1:59 PM. If you have any questions, ask your nurse or doctor.          STOP taking these medications    celecoxib 100 MG capsule Commonly known as: CELEBREX Stopped by: Elige Radon Edwardine Deschepper       TAKE these medications    acetaminophen 500 MG tablet Commonly known as: TYLENOL Take 1,000 mg by mouth every 6 (six) hours as needed for mild pain or moderate pain.   aspirin 325 MG tablet Take 325 mg by mouth every other day.   Benadryl Allergy 25 mg capsule Generic drug: diphenhydrAMINE Take 25 mg by mouth every 6 (six) hours as needed for itching or allergies.   gabapentin 300 MG capsule Commonly known as: NEURONTIN Take 300 mg by mouth 3 (three) times daily.   lamoTRIgine 25 MG tablet Commonly known as: LAMICTAL Take 2 tablets (50 mg total) by mouth 2 (two) times daily. What changed:  how much to take how to take this when to take this additional instructions Changed by: Levert Feinstein   lamoTRIgine 100 MG tablet Commonly known as: LAMICTAL Take 1 tablet (100 mg total) by mouth 2 (two) times daily. What changed: Another  medication with the same name was changed. Make sure you understand how and when to take each. Changed by: Levert Feinstein   prazosin 1 MG capsule Commonly known as: MINIPRESS Take 1 mg by mouth at bedtime.   prazosin 5 MG capsule Commonly known as: MINIPRESS Take 5 mg by mouth at bedtime.   sertraline 100 MG tablet Commonly known as: ZOLOFT Take 200 mg by mouth daily.         Objective:   BP 115/78   Pulse 78   Ht 6' (1.829 m)   Wt 178 lb (80.7 kg)   SpO2 96%   BMI 24.14 kg/m   Wt Readings from Last 3 Encounters:  04/20/23 178 lb (80.7 kg)  04/20/23 178 lb (80.7 kg)  03/25/23 174 lb (78.9 kg)    Physical Exam Vitals and nursing note reviewed.  Constitutional:      General: He is not in acute distress.    Appearance: He is  well-developed. He is not diaphoretic.  Eyes:     General: No scleral icterus.       Right eye: No discharge.     Conjunctiva/sclera: Conjunctivae normal.     Pupils: Pupils are equal, round, and reactive to light.  Neck:     Thyroid: No thyromegaly.  Cardiovascular:     Rate and Rhythm: Normal rate and regular rhythm.     Heart sounds: Normal heart sounds. No murmur heard. Pulmonary:     Effort: Pulmonary effort is normal. No respiratory distress.     Breath sounds: Normal breath sounds. No wheezing.  Abdominal:     General: Abdomen is flat. Bowel sounds are normal. There is no distension.     Palpations: Abdomen is soft.     Tenderness: There is abdominal tenderness in the left lower quadrant. There is no right CVA tenderness, left CVA tenderness, guarding or rebound.  Musculoskeletal:        General: Normal range of motion.     Cervical back: Neck supple.  Lymphadenopathy:     Cervical: No cervical adenopathy.  Skin:    General: Skin is warm and dry.     Findings: No rash.  Neurological:     Mental Status: He is alert and oriented to person, place, and time.     Coordination: Coordination normal.  Psychiatric:        Behavior: Behavior normal.       Assessment & Plan:   Problem List Items Addressed This Visit   None Visit Diagnoses       Left lower quadrant abdominal pain    -  Primary   Relevant Orders   CT ABDOMEN PELVIS W CONTRAST       Based on exam, most likely concern for muscular but he heard something pop in there so we will do the CT scan to check on the previous hernia.  Previous ultrasound inconclusive Follow up plan: Return if symptoms worsen or fail to improve.  Counseling provided for all of the vaccine components Orders Placed This Encounter  Procedures   CT ABDOMEN PELVIS W CONTRAST    Arville Care, MD [facility] 04/20/2023, 1:59 PM

## 2023-04-20 NOTE — Progress Notes (Signed)
 Chief Complaint  Patient presents with   New Problem     Pt in 14, here alone  Pt is referred for fall and loss of consciousness. Pt states last episode happened in oct/2024.      ASSESSMENT AND PLAN  Trevor Pacheco is a 38 y.o. male   Frequent transient confusional episode, Left-sided headache, neck pain  Normal MRI of the brain, EEG,  Recurrent transient body shaking, happened in the setting of depression anxiety shows her stress, improved with lamotrigine higher dose 150 mg twice a day, he is seeing his psychiatrist and primary care regularly,  I refilled his lamotrigine 100 +25 mg 2 tablets twice a day for 1 year,  He will continue refill prescription by his primary care, only return to clinic for recurrent spells, if he continue complains of seizure-like spells, may consider 72 hours video EEG monitoring  DIAGNOSTIC DATA (LABS, IMAGING, TESTING) - I reviewed patient records, labs, notes, testing and imaging myself where available.   MEDICAL HISTORY:  Trevor Pacheco, 38 year old male seen in request by   his primary care physician Dr. Bjorn Pippin, [facility], for evaluation of recurrent episode of confusion spells, left-sided headaches, initial evaluation was on Jun 13, 2021  I reviewed and summarized the referring note. PMHX.  Patient was brought in by transportation today, he has lost his job since 2021 due to frequent confusion spells, transient episode of zoning out, could not remember things, could not keep up with his job duty, it can happen multiple times in the day, lasting for few minutes,  Also complains of frequent left-sided headaches, splitting headache," as if there is a crack in my scalp," also have neck pain,   He was not able to have evaluation in the past due to financial issues, now he is on [facility] current sponsored care  UPDATE April 20 2023: MRI of the brain with without contrast in October 2023, and June 2023 was normal  MRI of cervical  spine from February 2024 showed mild degenerative changes, most noticeable C5-6, right paramedian disc herniation, no significant canal stenosis, with moderate right foraminal stenosis  MRI of thoracic spine was normal He called clinic in December 2024 reported 3 episode over 2 months, feels like pressure at the back of his head, going to pass out, while taking lamotrigine 125 mg twice a day, following that call dosage was increased to 150 mg twice a day, he has been doing very well, no longer have passing out spell  He is unemployed, lives with his mother and her friend, smoked marijuana EEG was normal in May 2023  He is on polypharmacy for her depression anxiety as well, including Zoloft 200 mg daily  PHYSICAL EXAM:   Vitals:   04/20/23 1116  BP: 109/72  Pulse: 68  Weight: 178 lb (80.7 kg)  Height: 6' (1.829 m)    Body mass index is 24.14 kg/m.  PHYSICAL EXAMNIATION:  Gen: NAD, conversant, well nourised, well groomed                     Cardiovascular: Regular rate rhythm, no peripheral edema, warm, nontender. Eyes: Conjunctivae clear without exudates or hemorrhage Neck: Supple, no carotid bruits. Pulmonary: Clear to auscultation bilaterally   NEUROLOGICAL EXAM:  MENTAL STATUS: Speech/cognition: Depressed anxious looking on The young man,   oriented to history taking and casual conversation CRANIAL NERVES: CN II: Visual fields are full to confrontation. Pupils are round equal and briskly reactive  to light. CN III, IV, VI: extraocular movement are normal. No ptosis. CN V: Facial sensation is intact to light touch CN VII: Face is symmetric with normal eye closure  CN VIII: Hearing is normal to causal conversation. CN IX, X: Phonation is normal. CN XI: Head turning and shoulder shrug are intact  MOTOR: Mild bilateral hand action tremor, no weakness,  REFLEXES: Reflexes are 3 and symmetric at the biceps, triceps, knees, and ankles clonus. Plantar responses are extensor  bilaterally  SENSORY: Intact to light touch, pinprick and vibratory sensation are intact in fingers and toes.  COORDINATION: There is no trunk or limb dysmetria noted.  GAIT/STANCE: He can get up from seated position, arm crossed, steady  REVIEW OF SYSTEMS:  Full 14 system review of systems performed and notable only for as above All other review of systems were negative.   ALLERGIES: Allergies  Allergen Reactions   Clindamycin/Lincomycin Other (See Comments)    ?  Pt says it caused an infection in his throat)   Penicillins Nausea And Vomiting    HOME MEDICATIONS: Current Outpatient Medications  Medication Sig Dispense Refill   acetaminophen (TYLENOL) 500 MG tablet Take 1,000 mg by mouth every 6 (six) hours as needed for mild pain or moderate pain.     aspirin 325 MG tablet Take 325 mg by mouth every other day.     celecoxib (CELEBREX) 100 MG capsule TAKE 1 CAPSULE BY MOUTH TWICE A DAY 60 capsule 1   diphenhydrAMINE (BENADRYL ALLERGY) 25 mg capsule Take 25 mg by mouth every 6 (six) hours as needed for itching or allergies.     gabapentin (NEURONTIN) 300 MG capsule Take 300 mg by mouth 3 (three) times daily.     lamoTRIgine (LAMICTAL) 100 MG tablet Take 1 tablet (100 mg total) by mouth 2 (two) times daily. 180 tablet 1   lamoTRIgine (LAMICTAL) 25 MG tablet TAKE 2 Tablets  BY MOUTH TWICE DAILY WITH LAMOTRIGINE 100MG  (IF YOU DEVELOP A RASH OR MISS MORE THAN 2 DAYS OF THERAPY, PLEASE CONTACT YOUR PROVIDER.) 120 tablet 5   prazosin (MINIPRESS) 1 MG capsule Take 1 mg by mouth at bedtime.     prazosin (MINIPRESS) 5 MG capsule Take 5 mg by mouth at bedtime.     sertraline (ZOLOFT) 100 MG tablet Take 200 mg by mouth daily.     No current facility-administered medications for this visit.    PAST MEDICAL HISTORY: Past Medical History:  Diagnosis Date   Heart murmur    Seizure (HCC)     PAST SURGICAL HISTORY: Past Surgical History:  Procedure Laterality Date   ABSCESS DRAINAGE      HAND SURGERY     INGUINAL LYMPH NODE BIOPSY Left 12/02/2022   Procedure: INGUINAL LYMPH NODE BIOPSY X2;  Surgeon: Lucretia Roers, MD;  Location: [facility];  Service: General;  Laterality: Left;   LESION DESTRUCTION N/A 04/15/2021   Procedure: LESION DESTRUCTION PENIS;  Surgeon: Milderd Meager., MD;  Location: [facility];  Service: Urology;  Laterality: N/A;   XI ROBOTIC ASSISTED INGUINAL HERNIA REPAIR WITH MESH Left 12/02/2022   Procedure: XI ROBOTIC ASSISTED INGUINAL HERNIA REPAIR WITH MESH;  Surgeon: Lucretia Roers, MD;  Location: [facility];  Service: General;  Laterality: Left;    FAMILY HISTORY: Family History  Problem Relation Age of Onset   Cirrhosis Father    Alcoholism Father    Seizures Father    Diabetes Maternal Aunt    Alzheimer's disease Neg Hx  SOCIAL HISTORY: Social History   Socioeconomic History   Marital status: Single    Spouse name: Not on file   Number of children: Not on file   Years of education: Not on file   Highest education level: 12th grade  Occupational History   Not on file  Tobacco Use   Smoking status: Every Day    Current packs/day: 0.50    Types: Cigarettes   Smokeless tobacco: Never   Tobacco comments:    04/20/23- pt states he smokes 1/2 pack a day   Vaping Use   Vaping status: Never Used  Substance and Sexual Activity   Alcohol use: Not Currently    Comment: hx heavy etoh.  none since age 23   Drug use: Yes    Types: Marijuana   Sexual activity: Not on file  Other Topics Concern   Not on file  Social History Narrative   Not on file   Social Drivers of Health   Financial Resource Strain: High Risk (04/20/2023)   Overall Financial Resource Strain (CARDIA)    Difficulty of Paying Living Expenses: Very hard  Food Insecurity: Food Insecurity Present (04/20/2023)   Hunger Vital Sign    Worried About Running Out of Food in the Last Year: Sometimes true    Ran Out of Food in the Last Year: Sometimes true  Transportation  Needs: Unmet Transportation Needs (04/20/2023)   PRAPARE - Transportation    Lack of Transportation (Medical): No    Lack of Transportation (Non-Medical): Yes  Physical Activity: Sufficiently Active (04/20/2023)   Exercise Vital Sign    Days of Exercise per Week: 7 days    Minutes of Exercise per Session: 30 min  Stress: No Stress Concern Present (04/20/2023)   [employer] of Occupational Health - Occupational Stress Questionnaire    Feeling of Stress : Only a little  Social Connections: Socially Isolated (04/20/2023)   Social Connection and Isolation Panel [NHANES]    Frequency of Communication with Friends and Family: Once a week    Frequency of Social Gatherings with Friends and Family: Never    Attends Religious Services: Never    Database administrator or Organizations: No    Attends Engineer, structural: Not on file    Marital Status: Never married  Intimate Partner Violence: Not on file      Levert Feinstein, M.D. Ph.D.  [facility] [address], [address] [place], [place] [address] Ph: [phone] Fax: [phone]  CC:  Arrie Senate, FNP [address] [place],  [place] [address]  Arrie Senate, FNP

## 2023-04-22 ENCOUNTER — Encounter: Payer: Self-pay | Admitting: Family Medicine

## 2023-04-27 ENCOUNTER — Ambulatory Visit: Payer: Medicaid Other | Admitting: Adult Health

## 2023-05-04 DIAGNOSIS — M5412 Radiculopathy, cervical region: Secondary | ICD-10-CM | POA: Diagnosis not present

## 2023-05-18 ENCOUNTER — Ambulatory Visit: Payer: Self-pay

## 2023-05-18 DIAGNOSIS — W57XXXA Bitten or stung by nonvenomous insect and other nonvenomous arthropods, initial encounter: Secondary | ICD-10-CM | POA: Diagnosis not present

## 2023-05-18 DIAGNOSIS — S40862A Insect bite (nonvenomous) of left upper arm, initial encounter: Secondary | ICD-10-CM | POA: Diagnosis not present

## 2023-05-18 NOTE — Telephone Encounter (Signed)
 Copied from CRM [id number]. Topic: Clinical - Red Word Triage >> May 18, 2023 11:31 AM Emylou G wrote: [facility] that prompted transfer to Nurse Triage: Spider bite, itching really bad.. bruising  Chief Complaint: spider bite to left arm Symptoms: itching Frequency: constant Pertinent Negatives: Patient denies fever, sob Disposition: [] ED /[x] Urgent Care (no appt availability in office) / [] Appointment(In office/virtual)/ []  [facility]/ [] Home Care/ [] Refused Recommended Disposition /[] [place] [facility]/ []  Follow-up with PCP Additional Notes: no apt available today; instructed to go to the ER; care advice given, denies questions; instructed to go to ER if becomes worse.   Reason for Disposition  [1] Red or very tender (to touch) area AND [2] started over 24 hours after the bite  Answer Assessment - Initial Assessment Questions 1. TYPE of SPIDER: "What type of spider was it?"  (e.g., name, unknown, or brief description)     Happened last night 2. LOCATION: "Where is the bite located?"      Left arm near elbow 3. PAIN: "Is there any pain?" If Yes, ask: "How bad is it?"  (Scale 1-10; or mild, moderate, severe)    - NONE (0): no pain    - MILD (1-3): doesn't interfere with normal activities     - MODERATE (4-7): interferes with normal activities or awakens from sleep     - SEVERE (8-10): excruciating pain, unable to do any normal activities     itches 4. SWELLING: "How big is the swelling?" (Inches, cm or compare to coins)      no 5. ONSET: "When did the bite occur?" (Minutes or hours ago)      Last night 6. TETANUS: "When was the last tetanus booster?"      Last year 7. OTHER SYMPTOMS: "Do you have any other symptoms?"  (e.g., muscle cramps, abdomen pain, change in urine color)     no  Protocols used: Spider Bite - [employer]

## 2023-05-20 IMAGING — CT CT HEART MORP W/ CTA COR W/ SCORE W/ CA W/CM &/OR W/O CM
1 of 9 series · 3 of 20 positions shown, 4 images · IV contrast (APPLIED)
Comparison: None.
COMPARISON: None.

Addendum:
EXAM:
OVER-READ INTERPRETATION  CT CHEST

The following report is an over-read performed by radiologist Dr.
Ladopoulos Desmond [[email or web address]] on 01/15/2021. This
over-read does not include interpretation of cardiac or coronary
anatomy or pathology. The coronary CTA interpretation by the
cardiologist is attached.
CLINICAL DATA: 35 Year old White Male
Cardiac/Coronary  CTA
TECHNIQUE: The patient was scanned on a Phillips Force scanner.

[Series 9: best diast · axial · 0.39mm/px · z∈[+1110,+1245]mm · 3 of 340 slices shown, 4 images]
[im 1/340  vessel]
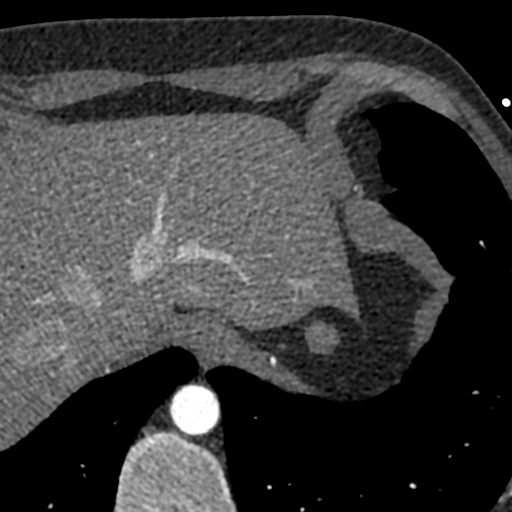
[im 1/340  lung]
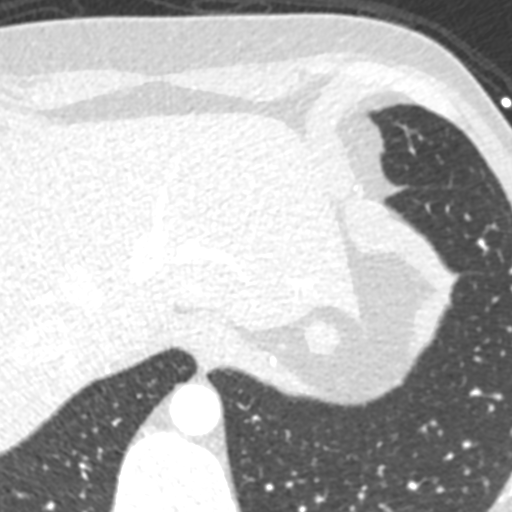
[im 170/340  vessel]
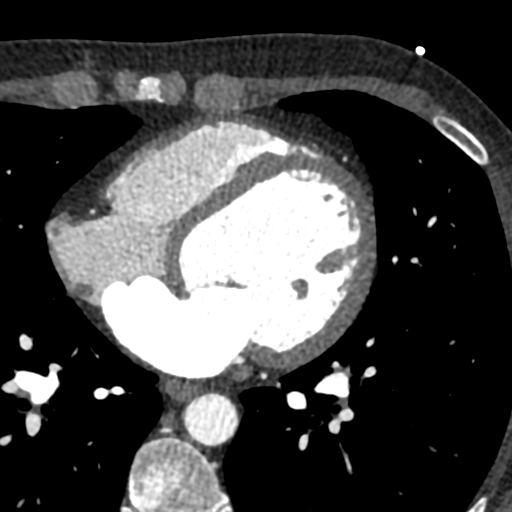
[im 340/340  vessel]
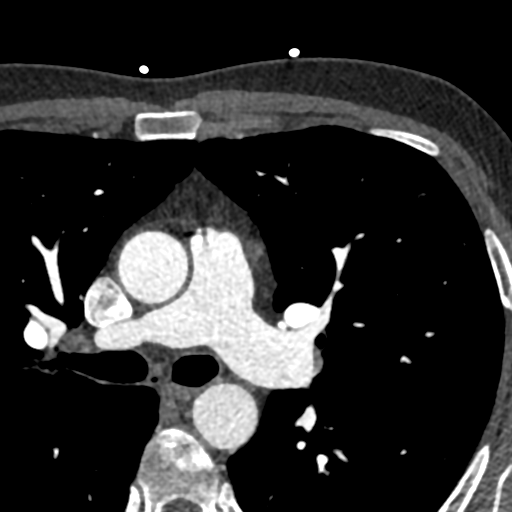

[3 of 20 positions shown; findings below may reference images not displayed]

FINDINGS: Vascular: Heart is normal size.  Aorta normal caliber.

Mediastinum/Nodes: No adenopathy

Lungs/Pleura: No confluent opacities or effusions.

Upper Abdomen: Imaging into the upper abdomen demonstrates no acute
findings.

Musculoskeletal: Chest wall soft tissues are unremarkable. No acute
bony abnormality.
IMPRESSION: No acute or significant extracardiac abnormality.
FINDINGS: Scan was triggered in the descending thoracic aorta. Axial
non-contrast 3 mm slices were carried out through the heart. The
data set was analyzed on a dedicated work station and scored using
the Agatson method. Gantry rotation speed was 250 msecs and
collimation was .6 mm. 0.8 mg of sl NTG was given. The 3D data set
was reconstructed in 5% intervals of the 67-82 % of the R-R cycle.
Diastolic phases were analyzed on a dedicated work station using
MPR, MIP and VRT modes. The patient received 95 cc of contrast.

Aorta:  Normal size.  No calcifications.  No dissection.

Main Pulmonary Artery: Normal size of the pulmonary artery.

Aortic Valve: No well characterized secondary due to motion
artifact. No calcifications.

Coronary Arteries:  Normal coronary origin.  Left dominance.

Coronary Calcium Score:

Left main: 0

Left anterior descending artery: 0

Left circumflex artery: 0

Right coronary artery: 0

Ramus intermedius: 0

Total: 0

Percentile: 1st for age, sex, and race matched control.

RCA is a small non-dominant artery.  There is no significant plaque.

Left main is a large artery that gives rise to LAD, RI,and LCX
arteries. There is no significant plaque.

LAD is a large vessel that gives rise to one large D1 Branch. There
is no significant plaque.

LCX is a non-dominant artery that gives rise to one large OM1 branch
and LPDA. There is no significant plaque.

There is a ramus intermedius vessel with small diameter. There is no
significant plaque.

Other findings:

Normal pulmonary vein drainage into the left atrium.

Normal left atrial appendage without a thrombus.

Atrial septum aneurysm without PFO.

Extra-cardiac findings: See attached radiology report for
non-cardiac structures.

Body motion artifact.
IMPRESSION: 1. Coronary calcium score of 0. This was 1st percentile for age,
sex, and race matched control.

2. Normal coronary origin with left dominance.

3. CAD-RADS 0. No evidence of CAD (0%). Consider non-atherosclerotic
causes of chest pain.

RECOMMENDATIONS:



If CAC = 0, it is reasonable to withhold statin therapy and reassess
in 5 to 10 years, as long as higher risk conditions are absent
(diabetes mellitus, family history of premature CHD in first degree
relatives (males <55 years; females <65 years), cigarette smoking,
LDL >=190 mg/dL or other independent risk factors).

If CAC is 1 to 99, it is reasonable to initiate statin therapy for
patients >=55 years of age.

If CAC is >=100 or >=75th percentile, it is reasonable to initiate
statin therapy at any age.

Cardiology referral should be considered for patients with CAC
scores =400 or >=75th percentile.

*6725 AHA/ACC/AACVPR/AAPA/ABC/KIMI/TIGER/RUDI/Konrad-King/MUTULLAH/BRONIA/YUDI
Guideline on the Management of Blood Cholesterol: A Report of the
American College of Cardiology/American Heart Association Task Force
on Clinical Practice Guidelines. J Am Coll Cardiol.
1640;73(24):8475-8536.

*** End of Addendum ***
EXAM:
OVER-READ INTERPRETATION  CT CHEST

The following report is an over-read performed by radiologist Dr.
Ladopoulos Desmond [[email or web address]] on 01/15/2021. This
over-read does not include interpretation of cardiac or coronary
anatomy or pathology. The coronary CTA interpretation by the
cardiologist is attached.
FINDINGS: Vascular: Heart is normal size.  Aorta normal caliber.

Mediastinum/Nodes: No adenopathy

Lungs/Pleura: No confluent opacities or effusions.

Upper Abdomen: Imaging into the upper abdomen demonstrates no acute
findings.

Musculoskeletal: Chest wall soft tissues are unremarkable. No acute
bony abnormality.
IMPRESSION: No acute or significant extracardiac abnormality.

## 2023-05-27 ENCOUNTER — Telehealth: Payer: Self-pay | Admitting: Family Medicine

## 2023-05-27 ENCOUNTER — Encounter: Payer: Self-pay | Admitting: Family Medicine

## 2023-05-27 ENCOUNTER — Ambulatory Visit (INDEPENDENT_AMBULATORY_CARE_PROVIDER_SITE_OTHER): Admitting: Family Medicine

## 2023-05-27 ENCOUNTER — Ambulatory Visit (HOSPITAL_COMMUNITY): Admission: RE | Admit: 2023-05-27 | Source: Ambulatory Visit

## 2023-05-27 VITALS — BP 109/70 | HR 64 | Temp 98.2°F | Ht 72.0 in | Wt 167.0 lb

## 2023-05-27 DIAGNOSIS — Z1331 Encounter for screening for depression: Secondary | ICD-10-CM

## 2023-05-27 DIAGNOSIS — R1032 Left lower quadrant pain: Secondary | ICD-10-CM | POA: Diagnosis not present

## 2023-05-27 DIAGNOSIS — G8929 Other chronic pain: Secondary | ICD-10-CM | POA: Diagnosis not present

## 2023-05-27 DIAGNOSIS — M542 Cervicalgia: Secondary | ICD-10-CM | POA: Diagnosis not present

## 2023-05-27 DIAGNOSIS — F419 Anxiety disorder, unspecified: Secondary | ICD-10-CM

## 2023-05-27 DIAGNOSIS — M47812 Spondylosis without myelopathy or radiculopathy, cervical region: Secondary | ICD-10-CM | POA: Diagnosis not present

## 2023-05-27 NOTE — Progress Notes (Signed)
 Subjective:  Patient ID: Trevor Pacheco, male    DOB: [birth year], 38 y.o.   MRN: [id number]  Patient Care Team: Chrystine Crate, FNP as PCP - General (Family Medicine) Mallipeddi, Kennyth Pean, MD as PCP - Cardiology (Cardiology)   Chief Complaint:  Groin Pain (Left side)  HPI: Trevor Pacheco is a 38 y.o. male presenting on 05/27/2023 for Groin Pain (Left side)  HPI Patient presents today with continued lower left side/groin pain and tightness. He was evaluated at [facility] by Dr. Steen Eden, one month ago for a "pop". Completed CT that was notable for No abnormality to explain reported left lower quadrant pain. Mild thickening of the walls of the cecum and terminal ileum, which may be infectious or inflammatory.Hepatic steatosis.and Small umbilical hernia.  He was instructed to rest and use OTC analgesics for pain. He reports compliance with this treatment regimen. Reports that 5 days ago he was trying to get out of bed and felt another "pop" this time worse than before. He states that he has been trying heat, ice, and his symptoms have not improved. Of note he has a history of XI ROBOTIC ASSISTED INGUINAL HERNIA REPAIR WITH MESH (Left) with INGUINAL LYMPH NODE BIOPSY X2 (Left) and was told by surgeon in pre-op visit that pain may not improve and may worsen with lymph node removal.    Relevant past medical, surgical, family, and social history reviewed and updated as indicated.  Allergies and medications reviewed and updated. Data reviewed: Chart in Epic.  Past Medical History:  Diagnosis Date   Heart murmur    Seizure [facility])    Past Surgical History:  Procedure Laterality Date   ABSCESS DRAINAGE     HAND SURGERY     INGUINAL LYMPH NODE BIOPSY Left 12/02/2022   Procedure: INGUINAL LYMPH NODE BIOPSY X2;  Surgeon: Awilda Bogus, MD;  Location: [facility];  Service: General;  Laterality: Left;   LESION DESTRUCTION N/A 04/15/2021   Procedure: LESION DESTRUCTION PENIS;  Surgeon:  Mellie Sprinkle., MD;  Location: [facility];  Service: Urology;  Laterality: N/A;   XI ROBOTIC ASSISTED INGUINAL HERNIA REPAIR WITH MESH Left 12/02/2022   Procedure: XI ROBOTIC ASSISTED INGUINAL HERNIA REPAIR WITH MESH;  Surgeon: Awilda Bogus, MD;  Location: [facility];  Service: General;  Laterality: Left;    Social History   Socioeconomic History   Marital status: Single    Spouse name: Not on file   Number of children: Not on file   Years of education: Not on file   Highest education level: 12th grade  Occupational History   Not on file  Tobacco Use   Smoking status: Every Day    Current packs/day: 0.50    Types: Cigarettes   Smokeless tobacco: Never   Tobacco comments:    04/20/23- pt states he smokes 1/2 pack a day   Vaping Use   Vaping status: Never Used  Substance and Sexual Activity   Alcohol use: Not Currently    Comment: hx heavy etoh.  none since age 40   Drug use: Yes    Types: Marijuana   Sexual activity: Not on file  Other Topics Concern   Not on file  Social History Narrative   Not on file   Social Drivers of Health   Financial Resource Strain: High Risk (04/20/2023)   Overall Financial Resource Strain (CARDIA)    Difficulty of Paying Living Expenses: Very hard  Food Insecurity: Food Insecurity Present (04/20/2023)  Hunger Vital Sign    Worried About Running Out of Food in the Last Year: Sometimes true    Ran Out of Food in the Last Year: Sometimes true  Transportation Needs: Unmet Transportation Needs (04/20/2023)   PRAPARE - Transportation    Lack of Transportation (Medical): No    Lack of Transportation (Non-Medical): Yes  Physical Activity: Sufficiently Active (04/20/2023)   Exercise Vital Sign    Days of Exercise per Week: 7 days    Minutes of Exercise per Session: 30 min  Stress: No Stress Concern Present (04/20/2023)   [employer] of Occupational Health - Occupational Stress Questionnaire    Feeling of Stress : Only a little  Social  Connections: Socially Isolated (04/20/2023)   Social Connection and Isolation Panel [NHANES]    Frequency of Communication with Friends and Family: Once a week    Frequency of Social Gatherings with Friends and Family: Never    Attends Religious Services: Never    Database administrator or Organizations: No    Attends Engineer, structural: Not on file    Marital Status: Never married  Intimate Partner Violence: Not on file    Outpatient Encounter Medications as of 05/27/2023  Medication Sig   acetaminophen  (TYLENOL ) 500 MG tablet Take 1,000 mg by mouth every 6 (six) hours as needed for mild pain or moderate pain.   aspirin 325 MG tablet Take 325 mg by mouth every other day.   diphenhydrAMINE (BENADRYL ALLERGY) 25 mg capsule Take 25 mg by mouth every 6 (six) hours as needed for itching or allergies.   gabapentin (NEURONTIN) 300 MG capsule Take 300 mg by mouth 3 (three) times daily.   lamoTRIgine  (LAMICTAL ) 100 MG tablet Take 1 tablet (100 mg total) by mouth 2 (two) times daily.   lamoTRIgine  (LAMICTAL ) 25 MG tablet Take 2 tablets (50 mg total) by mouth 2 (two) times daily.   prazosin (MINIPRESS) 1 MG capsule Take 1 mg by mouth at bedtime.   prazosin (MINIPRESS) 5 MG capsule Take 5 mg by mouth at bedtime.   sertraline (ZOLOFT) 100 MG tablet Take 200 mg by mouth daily.   No facility-administered encounter medications on file as of 05/27/2023.    Allergies  Allergen Reactions   Clindamycin /Lincomycin Other (See Comments)    ?  Pt says it caused an infection in his throat)   Penicillins Nausea And Vomiting    Review of Systems As per HPI  Objective:  BP 109/70   Pulse 64   Temp 98.2 F (36.8 C)   Ht 6' (1.829 m)   Wt 167 lb (75.8 kg)   SpO2 97%   BMI 22.65 kg/m    Wt Readings from Last 3 Encounters:  05/27/23 167 lb (75.8 kg)  04/20/23 178 lb (80.7 kg)  04/20/23 178 lb (80.7 kg)   Physical Exam Constitutional:      General: He is awake. He is not in acute  distress.    Appearance: He is well-developed. He is not ill-appearing, toxic-appearing or diaphoretic.  Cardiovascular:     Rate and Rhythm: Normal rate and regular rhythm.     Heart sounds: Normal heart sounds.  Pulmonary:     Effort: Pulmonary effort is normal.     Breath sounds: Normal breath sounds.  Abdominal:       Comments: Tender to palpation, hard, raised nodule, immobile   Skin:    General: Skin is warm.     Capillary Refill: Capillary refill takes less  than 2 seconds.  Neurological:     General: No focal deficit present.     Mental Status: He is alert and easily aroused. Mental status is at baseline.  Psychiatric:        Attention and Perception: Perception normal.        Mood and Affect: Affect is blunt.        Speech: Speech normal.        Behavior: Behavior is cooperative.        Thought Content: Thought content normal.        Cognition and Memory: Cognition is impaired. Memory is impaired. He exhibits impaired recent memory and impaired remote memory.        Judgment: Judgment is inappropriate.     Results for orders placed or performed during the hospital encounter of 12/02/22  Surgical pathology   Collection Time: 12/02/22  1:11 PM  Result Value Ref Range   SURGICAL PATHOLOGY      SURGICAL PATHOLOGY CASE: [id number] PATIENT: Raymon Caldron Surgical Pathology Report     Clinical History: Hernia, inguinal left, groin pain (crm)     FINAL MICROSCOPIC DIAGNOSIS:  A. LYMPH NODE, LEFT GROIN, BIOPSY: - Benign lymph nodes with reactive changes - No evidence of malignancy     GROSS DESCRIPTION:  Received fresh are 2 possible lymph node candidates measuring 0.9 x 0.7 x 0.5 cm and 1.5 x 0.8 x 0.7 cm.  Sectioning reveals underlying pink-tan, homogenous cut surfaces.  Central sections of both candidates are submitted in RPMI for possible flow.  The remaining specimen is entirely submitted as follows: A1 smaller candidate A2 larger  candidate (KW, 12/02/2022)   Final Diagnosis performed by Lee Public, MD.   Electronically signed 12/03/2022 Technical component performed at [facility], [address] [place], [place] [address].  Professional component performed at [facility], [address] [place], [place] [address].  Immunohistochemistry Technical component (if applicable) was performed at [facility]. [address], [place], [place] [address].   IMMUNOHISTOCHEMISTRY DISCLAIMER (if applicable): Some of these immunohistochemical stains may have been developed and the performance characteristics determine by [facility]. Some may not have been cleared or approved by the U.S. Food and Drug Administration. The FDA has determined that such clearance or approval is not necessary. This test is used for clinical purposes. It should not be regarded as investigational or for research. This laboratory is certified under the Clinical Laboratory Improvement Amendments of 1988 (CLIA-88) as qualified to perform high complexity clinical laboratory testing.  The controls stained appropriately.   IHC stains are performed on formalin fixed, paraffin embedded tissue using a 3,3"diaminobenzidine (DAB) chromogen and Leica Bond Autostain er System. The staining intensity of the nucleus is score manually and is reported as the percentage of tumor cell nuclei demonstrating specific nuclear staining. The specimens are fixed in 10% Neutral Formalin for at least 6 hours and up to 72hrs. These tests are validated on decalcified tissue. Results should be interpreted with caution given the possibility of false negative results on decalcified specimens. Antibody Clones are as follows ER-clone 72F, PR-clone 16, Ki67- clone MM1. Some of these immunohistochemical stains may have been developed and the performance characteristics determined by [facility]  Pathology.        05/27/2023   11:43 AM 04/20/2023    1:48 PM 10/23/2022    9:58 AM 08/08/2022    2:14 PM 06/11/2022   11:11 AM  Depression screen  PHQ 2/9  Decreased Interest 3 3 1 2 1   Down, Depressed, Hopeless 0 0 1 0 1  PHQ - 2 Score 3 3 2 2 2   Altered sleeping 2 1 0 1 1  Tired, decreased energy 3 2 1 1 1   Change in appetite 2 1 1  0 0  Feeling bad or failure about yourself  1 0 0 1 1  Trouble concentrating 3 3 1 2 2   Moving slowly or fidgety/restless 3 0 1 1 1   Suicidal thoughts 0 0 0 0 0  PHQ-9 Score 17 10 6 8 8   Difficult doing work/chores Extremely dIfficult Very difficult Somewhat difficult Somewhat difficult Very difficult       05/27/2023   11:43 AM 04/20/2023    1:48 PM 10/23/2022    9:58 AM 08/08/2022    2:15 PM  GAD 7 : Generalized Anxiety Score  Nervous, Anxious, on Edge 1 0 0 0  Control/stop worrying 1 1 1 1   Worry too much - different things 3 1 1 1   Trouble relaxing 1 1 1 1   Restless 0 0 1 1  Easily annoyed or irritable 3 3 1 3   Afraid - awful might happen 0 0 0 0  Total GAD 7 Score 9 6 5 7   Anxiety Difficulty Somewhat difficult  Somewhat difficult Somewhat difficult   Pertinent labs & imaging results that were available during my care of the patient were reviewed by me and considered in my medical decision making.  Assessment & Plan:  Sanuel was seen today for groin pain.  Diagnoses and all orders for this visit: 1. Acute left lower quadrant pain (Primary) Imaging as below. Will communicate results to patient once available. Will await results to determine next steps.  Labs as below. Will communicate results to patient once available. Will await results to determine next steps.  Reviewed notes from 10/23/22 "Can excise the nodes at the same times as the hernia surgery but may not help with the pain or may worsen the pain." Collene Dawson, MD - BMP8+EGFR - CBC with Differential/Platelet - US  Abdomen Limited; Future  2. Anxiety Not at goal. Denies SI. Will  have patient follow up for chronic condition management.   3. Encounter for screening for depression As above.    Continue all other maintenance medications.  Follow up plan: Return in about 4 weeks (around 06/24/2023) for Chronic Condition Follow up.   Continue healthy lifestyle choices, including diet (rich in fruits, vegetables, and lean proteins, and low in salt and simple carbohydrates) and exercise (at least 30 minutes of moderate physical activity daily).  Written and verbal instructions provided   The above assessment and management plan was discussed with the patient. The patient verbalized understanding of and has agreed to the management plan. Patient is aware to call the clinic if they develop any new symptoms or if symptoms persist or worsen. Patient is aware when to return to the clinic for a follow-up visit. Patient educated on when it is appropriate to go to the emergency department.   Jacqualyn Mates, DNP-FNP [facility] [address] [place], [place] [address] [phone]

## 2023-05-27 NOTE — Telephone Encounter (Unsigned)
 Copied from CRM [id number]. Topic: Appointments - Scheduling Inquiry for Clinic >> May 27, 2023 12:55 PM Suzette B wrote: Reason for CRM: [phone] patient has requested a call back for another appt for an ultrasound. He states due to lack of transportation he was unable to make it, but would like another appt as soon as possible so he could let transportation know.

## 2023-05-28 ENCOUNTER — Telehealth: Payer: Self-pay

## 2023-05-28 ENCOUNTER — Encounter: Payer: Self-pay | Admitting: Family Medicine

## 2023-05-28 LAB — BMP8+EGFR
BUN/Creatinine Ratio: 13 (ref 9–20)
BUN: 10 mg/dL (ref 6–20)
CO2: 19 mmol/L — ABNORMAL LOW (ref 20–29)
Calcium: 10.1 mg/dL (ref 8.7–10.2)
Chloride: 106 mmol/L (ref 96–106)
Creatinine, Ser: 0.79 mg/dL (ref 0.76–1.27)
Glucose: 98 mg/dL (ref 70–99)
Potassium: 4.7 mmol/L (ref 3.5–5.2)
Sodium: 144 mmol/L (ref 134–144)
eGFR: 118 mL/min/{1.73_m2} (ref >59)

## 2023-05-28 LAB — CBC WITH DIFFERENTIAL/PLATELET
Basophils Absolute: 0.1 10*3/uL (ref 0.0–0.2)
Basos: 1 %
EOS (ABSOLUTE): 0.1 10*3/uL (ref 0.0–0.4)
Eos: 1 %
Hematocrit: 44.5 % (ref 37.5–51.0)
Hemoglobin: 14.5 g/dL (ref 13.0–17.7)
Immature Grans (Abs): 0 10*3/uL (ref 0.0–0.1)
Immature Granulocytes: 0 %
Lymphocytes Absolute: 2.2 10*3/uL (ref 0.7–3.1)
Lymphs: 22 %
MCH: 30.3 pg (ref 26.6–33.0)
MCHC: 32.6 g/dL (ref 31.5–35.7)
MCV: 93 fL (ref 79–97)
Monocytes Absolute: 0.7 10*3/uL (ref 0.1–0.9)
Monocytes: 7 %
Neutrophils Absolute: 7 10*3/uL (ref 1.4–7.0)
Neutrophils: 69 %
Platelets: 402 10*3/uL (ref 150–450)
RBC: 4.78 x10E6/uL (ref 4.14–5.80)
RDW: 12.6 % (ref 11.6–15.4)
WBC: 10.2 10*3/uL (ref 3.4–10.8)

## 2023-05-28 NOTE — Progress Notes (Signed)
 No significant abnormalities on labs. Recommend patient complete imaging.

## 2023-05-28 NOTE — Telephone Encounter (Signed)
 Please have Patient call [facility] Centralized Scheduling at [phone], option 3 so Patient may reschedule Appt to a time that works best for him :)

## 2023-05-28 NOTE — Telephone Encounter (Signed)
 Copied from CRM [id number]. Topic: Clinical - Lab/Test Results >> May 28, 2023 12:02 PM Karole Pacer C wrote: Reason for CRM: Patient would like to know if the provider is able to send another order for an ultrasound to  [facility] because he has transportation today.

## 2023-05-28 NOTE — Telephone Encounter (Signed)
 Copied from CRM [id number]. Topic: General - Other >> May 28, 2023 12:54 PM Adrianna P wrote: Reason for CRM: Please call patient regarding ultrasound at [phone]

## 2023-05-29 NOTE — Telephone Encounter (Signed)
 Appointment has been made

## 2023-06-01 ENCOUNTER — Ambulatory Visit (HOSPITAL_COMMUNITY)
Admission: RE | Admit: 2023-06-01 | Discharge: 2023-06-01 | Disposition: A | Discharge status: Home / Self Care | Source: Ambulatory Visit | Attending: Family Medicine | Admitting: Family Medicine

## 2023-06-01 DIAGNOSIS — R1032 Left lower quadrant pain: Secondary | ICD-10-CM | POA: Diagnosis not present

## 2023-06-03 ENCOUNTER — Encounter: Payer: Self-pay | Admitting: Family Medicine

## 2023-06-03 NOTE — Progress Notes (Signed)
 No acute abnormalities found on exam. Is patient still experiencing pain?

## 2023-06-04 NOTE — Progress Notes (Signed)
 Pt states he is still experiencing pain, sort of like a muscle spasm when he gets up and moves it triggers it. Pt thinks it could probably be a muscle spasm? Pt is going to a pain specialist about his neck and is getting injections for this, pt wants to know if Connell Degree can write him out of work. Pt states that he was told to ask PCP for this note.

## 2023-06-15 DIAGNOSIS — M47812 Spondylosis without myelopathy or radiculopathy, cervical region: Secondary | ICD-10-CM | POA: Diagnosis not present

## 2023-06-29 DIAGNOSIS — M47812 Spondylosis without myelopathy or radiculopathy, cervical region: Secondary | ICD-10-CM | POA: Diagnosis not present

## 2023-06-29 DIAGNOSIS — G8929 Other chronic pain: Secondary | ICD-10-CM | POA: Diagnosis not present

## 2023-07-14 DIAGNOSIS — F410 Panic disorder [episodic paroxysmal anxiety] without agoraphobia: Secondary | ICD-10-CM | POA: Diagnosis not present

## 2023-07-14 DIAGNOSIS — F4310 Post-traumatic stress disorder, unspecified: Secondary | ICD-10-CM | POA: Diagnosis not present

## 2023-07-14 DIAGNOSIS — F329 Major depressive disorder, single episode, unspecified: Secondary | ICD-10-CM | POA: Diagnosis not present

## 2023-09-07 DIAGNOSIS — M129 Arthropathy, unspecified: Secondary | ICD-10-CM | POA: Diagnosis not present

## 2023-09-07 DIAGNOSIS — M546 Pain in thoracic spine: Secondary | ICD-10-CM | POA: Diagnosis not present

## 2023-09-07 DIAGNOSIS — Z79899 Other long term (current) drug therapy: Secondary | ICD-10-CM | POA: Diagnosis not present

## 2023-09-07 DIAGNOSIS — G40A09 Absence epileptic syndrome, not intractable, without status epilepticus: Secondary | ICD-10-CM | POA: Diagnosis not present

## 2023-09-07 DIAGNOSIS — M542 Cervicalgia: Secondary | ICD-10-CM | POA: Diagnosis not present

## 2023-09-07 DIAGNOSIS — R0602 Shortness of breath: Secondary | ICD-10-CM | POA: Diagnosis not present

## 2023-09-11 DIAGNOSIS — Z79899 Other long term (current) drug therapy: Secondary | ICD-10-CM | POA: Diagnosis not present

## 2023-09-11 DIAGNOSIS — M129 Arthropathy, unspecified: Secondary | ICD-10-CM | POA: Diagnosis not present

## 2023-09-30 DIAGNOSIS — M47812 Spondylosis without myelopathy or radiculopathy, cervical region: Secondary | ICD-10-CM | POA: Diagnosis not present

## 2023-10-07 DIAGNOSIS — F410 Panic disorder [episodic paroxysmal anxiety] without agoraphobia: Secondary | ICD-10-CM | POA: Diagnosis not present

## 2023-10-07 DIAGNOSIS — F4310 Post-traumatic stress disorder, unspecified: Secondary | ICD-10-CM | POA: Diagnosis not present

## 2023-10-07 DIAGNOSIS — F329 Major depressive disorder, single episode, unspecified: Secondary | ICD-10-CM | POA: Diagnosis not present

## 2023-10-08 DIAGNOSIS — Z79899 Other long term (current) drug therapy: Secondary | ICD-10-CM | POA: Diagnosis not present

## 2023-10-28 DIAGNOSIS — G8929 Other chronic pain: Secondary | ICD-10-CM | POA: Diagnosis not present

## 2023-10-28 DIAGNOSIS — M542 Cervicalgia: Secondary | ICD-10-CM | POA: Diagnosis not present

## 2023-10-28 DIAGNOSIS — M5412 Radiculopathy, cervical region: Secondary | ICD-10-CM | POA: Diagnosis not present

## 2023-10-28 DIAGNOSIS — M47812 Spondylosis without myelopathy or radiculopathy, cervical region: Secondary | ICD-10-CM | POA: Diagnosis not present

## 2023-10-31 DIAGNOSIS — S90561A Insect bite (nonvenomous), right ankle, initial encounter: Secondary | ICD-10-CM | POA: Diagnosis not present

## 2023-11-04 DIAGNOSIS — R030 Elevated blood-pressure reading, without diagnosis of hypertension: Secondary | ICD-10-CM | POA: Diagnosis not present

## 2023-11-04 DIAGNOSIS — M47812 Spondylosis without myelopathy or radiculopathy, cervical region: Secondary | ICD-10-CM | POA: Diagnosis not present

## 2023-11-04 DIAGNOSIS — Z6822 Body mass index (BMI) 22.0-22.9, adult: Secondary | ICD-10-CM | POA: Diagnosis not present

## 2023-11-04 DIAGNOSIS — R0989 Other specified symptoms and signs involving the circulatory and respiratory systems: Secondary | ICD-10-CM | POA: Diagnosis not present

## 2023-11-04 DIAGNOSIS — Z79899 Other long term (current) drug therapy: Secondary | ICD-10-CM | POA: Diagnosis not present

## 2023-11-05 ENCOUNTER — Telehealth: Payer: Self-pay

## 2023-11-05 NOTE — Telephone Encounter (Signed)
 Called and spoke with patient and made him aware to call PT office next door and request that they send his records to [facility].    Copied from CRM [id number]. Topic: General - Other >> Nov 05, 2023  2:06 PM Antwanette L wrote: Reason for CRM: The patient is requesting that the office fax physical therapy information to [facility].

## 2023-11-11 DIAGNOSIS — J4530 Mild persistent asthma, uncomplicated: Secondary | ICD-10-CM | POA: Diagnosis not present

## 2023-11-18 DIAGNOSIS — R053 Chronic cough: Secondary | ICD-10-CM | POA: Diagnosis not present

## 2023-11-20 DIAGNOSIS — R258 Other abnormal involuntary movements: Secondary | ICD-10-CM | POA: Diagnosis not present

## 2023-11-20 DIAGNOSIS — M542 Cervicalgia: Secondary | ICD-10-CM | POA: Diagnosis not present

## 2023-11-20 DIAGNOSIS — R292 Abnormal reflex: Secondary | ICD-10-CM | POA: Diagnosis not present

## 2023-11-20 DIAGNOSIS — M5020 Other cervical disc displacement, unspecified cervical region: Secondary | ICD-10-CM | POA: Diagnosis not present

## 2023-12-01 DIAGNOSIS — R29898 Other symptoms and signs involving the musculoskeletal system: Secondary | ICD-10-CM | POA: Diagnosis not present

## 2023-12-01 DIAGNOSIS — M5020 Other cervical disc displacement, unspecified cervical region: Secondary | ICD-10-CM | POA: Diagnosis not present

## 2023-12-01 DIAGNOSIS — M4802 Spinal stenosis, cervical region: Secondary | ICD-10-CM | POA: Diagnosis not present

## 2023-12-01 DIAGNOSIS — R200 Anesthesia of skin: Secondary | ICD-10-CM | POA: Diagnosis not present

## 2023-12-04 DIAGNOSIS — Z79899 Other long term (current) drug therapy: Secondary | ICD-10-CM | POA: Diagnosis not present

## 2023-12-04 DIAGNOSIS — R030 Elevated blood-pressure reading, without diagnosis of hypertension: Secondary | ICD-10-CM | POA: Diagnosis not present

## 2023-12-04 DIAGNOSIS — Z6821 Body mass index (BMI) 21.0-21.9, adult: Secondary | ICD-10-CM | POA: Diagnosis not present

## 2023-12-04 DIAGNOSIS — M47812 Spondylosis without myelopathy or radiculopathy, cervical region: Secondary | ICD-10-CM | POA: Diagnosis not present

## 2023-12-29 DIAGNOSIS — F4310 Post-traumatic stress disorder, unspecified: Secondary | ICD-10-CM | POA: Diagnosis not present

## 2023-12-29 DIAGNOSIS — F329 Major depressive disorder, single episode, unspecified: Secondary | ICD-10-CM | POA: Diagnosis not present

## 2023-12-29 DIAGNOSIS — F410 Panic disorder [episodic paroxysmal anxiety] without agoraphobia: Secondary | ICD-10-CM | POA: Diagnosis not present

## 2024-01-04 DIAGNOSIS — Z79899 Other long term (current) drug therapy: Secondary | ICD-10-CM | POA: Diagnosis not present

## 2024-03-07 ENCOUNTER — Ambulatory Visit: Admitting: Physical Therapy

## 2024-03-30 ENCOUNTER — Ambulatory Visit: Admitting: Internal Medicine
